# Patient Record
Sex: Female | Born: 1985
Health system: Southern US, Community
[De-identification: ages and names within clinical notes are randomized; demographics above are authoritative.]

## PROBLEM LIST (undated history)

## (undated) DIAGNOSIS — E669 Obesity, unspecified: Secondary | ICD-10-CM

## (undated) DIAGNOSIS — F419 Anxiety disorder, unspecified: Secondary | ICD-10-CM

## (undated) DIAGNOSIS — E162 Hypoglycemia, unspecified: Secondary | ICD-10-CM

## (undated) DIAGNOSIS — Z3490 Encounter for supervision of normal pregnancy, unspecified, unspecified trimester: Secondary | ICD-10-CM

## (undated) DIAGNOSIS — R0602 Shortness of breath: Secondary | ICD-10-CM

## (undated) DIAGNOSIS — Z8709 Personal history of other diseases of the respiratory system: Secondary | ICD-10-CM

## (undated) DIAGNOSIS — Z8759 Personal history of other complications of pregnancy, childbirth and the puerperium: Secondary | ICD-10-CM

## (undated) DIAGNOSIS — Z5189 Encounter for other specified aftercare: Secondary | ICD-10-CM

## (undated) DIAGNOSIS — O24419 Gestational diabetes mellitus in pregnancy, unspecified control: Secondary | ICD-10-CM

## (undated) HISTORY — DX: Gestational diabetes mellitus in pregnancy, unspecified control: O24.419

## (undated) HISTORY — DX: Hypoglycemia, unspecified: E16.2

## (undated) HISTORY — DX: Encounter for other specified aftercare: Z51.89

## (undated) HISTORY — DX: Personal history of other complications of pregnancy, childbirth and the puerperium: Z87.59

## (undated) HISTORY — DX: Obesity, unspecified: E66.9

## (undated) HISTORY — DX: Personal history of other diseases of the respiratory system: Z87.09

## (undated) HISTORY — DX: Anxiety disorder, unspecified: F41.9

---
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
## 2006-03-22 HISTORY — PX: DILATION AND CURETTAGE OF UTERUS: SHX78

## 2010-03-22 HISTORY — PX: OVARY SURGERY: SHX727

## 2010-03-22 HISTORY — PX: LEFT OOPHORECTOMY: SHX1961

## 2016-06-23 ENCOUNTER — Emergency Department (HOSPITAL_COMMUNITY)
Admission: EM | Admit: 2016-06-23 | Discharge: 2016-06-23 | Disposition: A | Discharge status: Home / Self Care | Payer: Self-pay

## 2016-08-31 ENCOUNTER — Ambulatory Visit: Payer: Self-pay | Admitting: Family Medicine

## 2016-09-30 ENCOUNTER — Encounter (HOSPITAL_COMMUNITY): Payer: Self-pay | Admitting: Emergency Medicine

## 2016-09-30 ENCOUNTER — Emergency Department (HOSPITAL_COMMUNITY)
Admission: EM | Admit: 2016-09-30 | Discharge: 2016-09-30 | Disposition: A | Discharge status: Home / Self Care | Payer: Self-pay | Attending: Emergency Medicine | Admitting: Emergency Medicine

## 2016-09-30 DIAGNOSIS — L02411 Cutaneous abscess of right axilla: Secondary | ICD-10-CM | POA: Insufficient documentation

## 2016-09-30 DIAGNOSIS — Z5321 Procedure and treatment not carried out due to patient leaving prior to being seen by health care provider: Secondary | ICD-10-CM | POA: Insufficient documentation

## 2016-09-30 NOTE — ED Triage Notes (Signed)
Pt presents with abscess to R axilla. Appeared 2 days ago. Hx of same.

## 2016-10-14 ENCOUNTER — Ambulatory Visit (INDEPENDENT_AMBULATORY_CARE_PROVIDER_SITE_OTHER): Payer: Self-pay | Admitting: Family Medicine

## 2016-10-14 ENCOUNTER — Encounter: Payer: Self-pay | Admitting: Family Medicine

## 2016-10-14 VITALS — BP 128/80 | HR 82 | Temp 98.1°F | Resp 16 | Ht 63.0 in | Wt 233.0 lb

## 2016-10-14 DIAGNOSIS — L723 Sebaceous cyst: Secondary | ICD-10-CM

## 2016-10-14 DIAGNOSIS — Z1389 Encounter for screening for other disorder: Secondary | ICD-10-CM

## 2016-10-14 LAB — POCT URINALYSIS DIP (DEVICE)
Bilirubin Urine: NEGATIVE
Glucose, UA: NEGATIVE mg/dL
KETONES UR: NEGATIVE mg/dL
Nitrite: NEGATIVE
PH: 5.5 (ref 5.0–8.0)
PROTEIN: NEGATIVE mg/dL
Urobilinogen, UA: 0.2 mg/dL (ref 0.0–1.0)

## 2016-10-14 MED ORDER — FLUCONAZOLE 150 MG PO TABS: 150.0000 mg | ORAL_TABLET | Freq: Once | ORAL | 0 refills | Status: AC

## 2016-10-14 MED ORDER — SULFAMETHOXAZOLE-TRIMETHOPRIM 800-160 MG PO TABS: 1.0000 | ORAL_TABLET | Freq: Two times a day (BID) | ORAL | 0 refills | Status: DC

## 2016-10-14 NOTE — Progress Notes (Signed)
Patient ID: Jennifer KinsmanLatasha Ellison, female    DOB: 1985-10-23, 31 y.o.   MRN: [id number]  PCP: Bing NeighborsHarris, Jaxsyn Catalfamo S, FNP  Chief Complaint  Patient presents with  . Establish Care  . Recurrent Skin Infections    under right armpit    Subjective:  HPI Jennifer KinsmanLatasha Ellison is a 31 y.o. female presents to establish care and for evaluation of skin irritation of right armpit. Medical history only significant hypoglycemia,ovarian cyst resulting in ovary removal (uncertain if right or left), and migraines Family history only significant for diabetes ( distant relatives only). Jennifer Ellison reports recurrent "boils" under bilateral axilla.  She has had prior boils I&D and has taking bactrim for prior infections. Social History   Social History  . Marital status: Single    Spouse name: N/A  . Number of children: N/A  . Years of education: N/A   Occupational History  . Not on file.   Social History Main Topics  . Smoking status: Never Smoker  . Smokeless tobacco: Never Used  . Alcohol use Yes  . Drug use: No  . Sexual activity: Not on file   Other Topics Concern  . Not on file   Social History Narrative  . No narrative on file   Review of Systems  HENT:       Seasonal allergies-takes Allegra  Respiratory: Negative.   Cardiovascular: Negative.   Gastrointestinal: Negative.   Endocrine: Negative.   Genitourinary: Negative.   Musculoskeletal: Negative.   Skin: Negative.   Neurological: Negative for dizziness.  Psychiatric/Behavioral: The patient is nervous/anxious.    Prior to Admission medications   Not on File    Past Medical, Surgical Family and Social History reviewed and updated.    Objective:   Today's Vitals   10/14/16 1322  BP: 128/80  Pulse: 82  Resp: 16  Temp: 98.1 F (36.7 C)  TempSrc: Oral  SpO2: 100%  Weight: 233 lb (105.7 kg)  Height: 5\' 3"  (1.6 m)    Wt Readings from Last 3 Encounters:  10/14/16 233 lb (105.7 kg)  09/30/16 210 lb (95.3 kg)   Physical  Exam  Constitutional: She is oriented to person, place, and time. She appears well-developed and well-nourished.  HENT:  Head: Normocephalic and atraumatic.  Eyes: Pupils are equal, round, and reactive to light. Conjunctivae are normal.  Neck: Normal range of motion. Neck supple.  Cardiovascular: Normal rate, regular rhythm, normal heart sounds and intact distal pulses.   Pulmonary/Chest: Effort normal and breath sounds normal.    Neurological: She is alert and oriented to person, place, and time.  Skin: Skin is warm and dry.  Psychiatric: She has a normal mood and affect. Her behavior is normal. Judgment and thought content normal.   Assessment & Plan:  1. Sebaceous cyst of right axilla, initiate antibiotic treatment initially and will attempt and  I&D if cyst doesn't resolve. - Meds ordered this encounter  Medications  . fluconazole (DIFLUCAN) 150 MG tablet    Sig: Take 1 tablet (150 mg total) by mouth once.    Dispense:  1 tablet    Refill:  0    Order Specific Question:   Supervising Provider    Answer:   Quentin AngstJEGEDE, OLUGBEMIGA E [id number]]  . sulfamethoxazole-trimethoprim (BACTRIM DS,SEPTRA DS) 800-160 MG tablet    Sig: Take 1 tablet by mouth 2 (two) times daily.    Dispense:  20 tablet    Refill:  0    Order Specific Question:   Supervising Provider  AnswerQuentin Angst:   JEGEDE, OLUGBEMIGA E [[id number]]    RTC: 2 weeks re-evaluation of right axilla cyst    Godfrey PickKimberly S. Tiburcio PeaHarris, MSN, FNP-C [facility]  [address] [place], [place] [address] [phone]

## 2016-10-14 NOTE — Patient Instructions (Signed)
Skin Abscess A skin abscess is an infected area on or under your skin that contains pus and other material. An abscess can happen almost anywhere on your body. Some abscesses break open (rupture) on their own. Most continue to get worse unless they are treated. The infection can spread deeper into the body and into your blood, which can make you feel sick. Treatment usually involves draining the abscess. Follow these instructions at home: Abscess Care  If you have an abscess that has not drained, place a warm, clean, wet washcloth over the abscess several times a day. Do this as told by your doctor.  Follow instructions from your doctor about how to take care of your abscess. Make sure you: ? Cover the abscess with a bandage (dressing). ? Change your bandage or gauze as told by your doctor. ? Wash your hands with soap and water before you change the bandage or gauze. If you cannot use soap and water, use hand sanitizer.  Check your abscess every day for signs that the infection is getting worse. Check for: ? More redness, swelling, or pain. ? More fluid or blood. ? Warmth. ? More pus or a bad smell. Medicines   Take over-the-counter and prescription medicines only as told by your doctor.  If you were prescribed an antibiotic medicine, take it as told by your doctor. Do not stop taking the antibiotic even if you start to feel better. General instructions  To avoid spreading the infection: ? Do not share personal care items, towels, or hot tubs with others. ? Avoid making skin-to-skin contact with other people.  Keep all follow-up visits as told by your doctor. This is important. Contact a doctor if:  You have more redness, swelling, or pain around your abscess.  You have more fluid or blood coming from your abscess.  Your abscess feels warm when you touch it.  You have more pus or a bad smell coming from your abscess.  You have a fever.  Your muscles ache.  You have  chills.  You feel sick. Get help right away if:  You have very bad (severe) pain.  You see red streaks on your skin spreading away from the abscess. This information is not intended to replace advice given to you by your health care provider. Make sure you discuss any questions you have with your health care provider. Document Released: 08/25/2007 Document Revised: 11/02/2015 Document Reviewed: 01/15/2015 Elsevier Interactive Patient Education  2018 Elsevier Inc[employer]  

## 2016-10-19 ENCOUNTER — Encounter: Payer: Self-pay | Admitting: Family Medicine

## 2016-10-20 ENCOUNTER — Ambulatory Visit: Payer: Self-pay | Admitting: Family Medicine

## 2016-10-27 ENCOUNTER — Encounter: Payer: Self-pay | Admitting: Family Medicine

## 2016-10-27 ENCOUNTER — Ambulatory Visit (INDEPENDENT_AMBULATORY_CARE_PROVIDER_SITE_OTHER): Payer: Self-pay | Admitting: Family Medicine

## 2016-10-27 DIAGNOSIS — L723 Sebaceous cyst: Secondary | ICD-10-CM

## 2016-10-27 MED ORDER — MELOXICAM 15 MG PO TABS: 15.0000 mg | ORAL_TABLET | Freq: Every day | ORAL | 0 refills | Status: DC

## 2016-10-27 MED ORDER — SULFAMETHOXAZOLE-TRIMETHOPRIM 800-160 MG PO TABS: 1.0000 | ORAL_TABLET | Freq: Two times a day (BID) | ORAL | 0 refills | Status: DC

## 2016-10-27 MED ORDER — SULFAMETHOXAZOLE-TRIMETHOPRIM 800-160 MG PO TABS: 1.0000 | ORAL_TABLET | Freq: Two times a day (BID) | ORAL | 0 refills | Status: AC

## 2016-10-31 NOTE — Progress Notes (Signed)
Patient ID: Jennifer Ellison, female    DOB: Jan 01, 1986, 31 y.o.   MRN: [id number]  PCP: Jennifer Neighbors, Jennifer Ellison  Chief Complaint  Patient presents with  . Follow-up    CYST UNDER ARM    Subjective:  HPI Jennifer Ellison is a 31 y.o. female presents for evaluation of cyst under right axilla with acute right arm pain radiating down upper arm pain. Jennifer Ellison was seen in office on 10/14/2016 and was evaluated for a suspicious cyst of the right axilla. The cyst was nonfluctuant and was thought to be unsuccessful if I&D were attempted. Jennifer Ellison was prescribed 10 days of Bactrim to treat infection as cyst were very painful to touch and has surrounding erythema. Since that visit she has completed the course of Bactrim. She reports that the cyst under her arms and no longer painful however remained hardened to touch. She complains of neck pain radiating from her axilla and to her upper anterior right arm. She denies any associated swelling, recent injury, or erythema. Pain is most pronounced with deep palpation or with pressure to the right upper arm just above the brachial region. She denies any associated fever, chills, or tenderness to touch cyst.  Social History   Social History  . Marital status: Single    Spouse name: N/A  . Number of children: N/A  . Years of education: N/A   Occupational History  . Not on file.   Social History Main Topics  . Smoking status: Never Smoker  . Smokeless tobacco: Never Used  . Alcohol use Yes  . Drug use: No  . Sexual activity: Not on file   Other Topics Concern  . Not on file   Social History Narrative  . No narrative on file    Family History  Problem Relation Age of Onset  . Asthma Brother   . Diabetes Maternal Grandmother    Review of Systems  See history of present illness No Known Allergies  Prior to Admission medications   Medication Sig Start Date End Date Taking? Authorizing Provider  sulfamethoxazole-trimethoprim (BACTRIM  DS,SEPTRA DS) 800-160 MG tablet Take 1 tablet by mouth 2 (two) times daily. 10/27/16 11/03/16 Yes Jennifer Neighbors, Jennifer Ellison  meloxicam (MOBIC) 15 MG tablet Take 1 tablet (15 mg total) by mouth daily. 10/27/16   Jennifer Neighbors, Jennifer Ellison    Past Medical, Surgical Family and Social History reviewed and updated.    Objective:   Today's Vitals   10/27/16 1341  BP: 117/66  Pulse: 73  Resp: 16  Temp: 98.2 F (36.8 C)  TempSrc: Oral  SpO2: 99%  Weight: 237 lb (107.5 kg)  Height: 5\' 3"  (1.6 m)    Wt Readings from Last 3 Encounters:  10/27/16 237 lb (107.5 kg)  10/14/16 233 lb (105.7 kg)  09/30/16 210 lb (95.3 kg)   Physical Exam  Constitutional: She appears well-developed and well-nourished.  HENT:  Head: Normocephalic and atraumatic.  Eyes: Pupils are equal, round, and reactive to light. Conjunctivae and EOM are normal.  Pulmonary/Chest: Effort normal.  Musculoskeletal: Normal range of motion.       Arms: Neurological: She is alert.  Skin: Skin is warm and dry.  Today's circular shaped nonfluctuant sebaceous cyst affixed under the right axilla. The erythema has resolved.  Psychiatric: She has a normal mood and affect. Her behavior is normal. Judgment and thought content normal.   Assessment & Plan:  1. Sebaceous cyst of right axilla, hardened erythemic adjacent masses. Unable to I&D due  to non-fluctuant masses. No known explanation for the radiating pain into the upper right arm. Will extend course of antibiotics for an additional 7 days and attempt another I&D in 1 week. - sulfamethoxazole-trimethoprim (BACTRIM DS,SEPTRA DS) 800-160 MG tablet; Take 1 tablet by mouth 2 (two) times daily.  Dispense: 14 tablet; Refill: 0  Return to care after completion of antibiotics.   Godfrey PickKimberly S. Tiburcio PeaHarris, MSN, Jennifer Ellison-C [facility]  [address] [place], [place] [address] [phone]

## 2016-11-24 ENCOUNTER — Ambulatory Visit: Payer: Medicaid Other | Admitting: Family Medicine

## 2016-12-15 ENCOUNTER — Encounter: Payer: Self-pay | Admitting: Family Medicine

## 2016-12-15 ENCOUNTER — Ambulatory Visit (INDEPENDENT_AMBULATORY_CARE_PROVIDER_SITE_OTHER): Payer: Self-pay | Admitting: Family Medicine

## 2016-12-15 VITALS — BP 134/90 | HR 72 | Temp 97.6°F | Resp 16 | Ht 63.0 in | Wt 239.0 lb

## 2016-12-15 DIAGNOSIS — J0100 Acute maxillary sinusitis, unspecified: Secondary | ICD-10-CM

## 2016-12-15 LAB — POCT URINALYSIS DIP (DEVICE)
BILIRUBIN URINE: NEGATIVE
GLUCOSE, UA: NEGATIVE mg/dL
KETONES UR: NEGATIVE mg/dL
Nitrite: NEGATIVE
PROTEIN: NEGATIVE mg/dL
Urobilinogen, UA: 0.2 mg/dL (ref 0.0–1.0)
pH: 7 (ref 5.0–8.0)

## 2016-12-15 MED ORDER — AMOXICILLIN-POT CLAVULANATE 875-125 MG PO TABS: 1.0000 | ORAL_TABLET | Freq: Two times a day (BID) | ORAL | 0 refills | Status: DC

## 2016-12-15 MED ORDER — FLUCONAZOLE 150 MG PO TABS: 150.0000 mg | ORAL_TABLET | Freq: Once | ORAL | 0 refills | Status: AC

## 2016-12-15 MED ORDER — IPRATROPIUM BROMIDE 0.03 % NA SOLN: 2.0000 | Freq: Two times a day (BID) | NASAL | 0 refills | Status: DC

## 2016-12-15 MED FILL — FLUCONAZOLE 150 MG TABLET: 150 | 2 days supply | Qty: 2 | Fill #0

## 2016-12-15 MED FILL — AMOX TR-K CLV 875-125 MG TA: 875-125 | 10 days supply | Qty: 20 | Fill #0

## 2016-12-15 NOTE — Patient Instructions (Addendum)
Goodrx.med to see where you can obtain your prescriptions cheaper.  Start Augmentin 1 tablet twice daily with food to avoid stomach upset. Complete all medication.  Take Diflucan 150 mg once if vaginal irritation develops  Administer atrovent nasal spray twice daily as needed for nasal congesiton.  Complete [employer] Patient assistance application    Sinusitis, Adult Sinusitis is soreness and inflammation of your sinuses. Sinuses are hollow spaces in the bones around your face. They are located:  Around your eyes.  In the middle of your forehead.  Behind your nose.  In your cheekbones.  Your sinuses and nasal passages are lined with a stringy fluid (mucus). Mucus normally drains out of your sinuses. When your nasal tissues get inflamed or swollen, the mucus can get trapped or blocked so air cannot flow through your sinuses. This lets bacteria, viruses, and funguses grow, and that leads to infection. Follow these instructions at home: Medicines  Take, use, or apply over-the-counter and prescription medicines only as told by your doctor. These may include nasal sprays.  If you were prescribed an antibiotic medicine, take it as told by your doctor. Do not stop taking the antibiotic even if you start to feel better. Hydrate and Humidify  Drink enough water to keep your pee (urine) clear or pale yellow.  Use a cool mist humidifier to keep the humidity level in your home above 50%.  Breathe in steam for 10-15 minutes, 3-4 times a day or as told by your doctor. You can do this in the bathroom while a hot shower is running.  Try not to spend time in cool or dry air. Rest  Rest as much as possible.  Sleep with your head raised (elevated).  Make sure to get enough sleep each night. General instructions  Put a warm, moist washcloth on your face 3-4 times a day or as told by your doctor. This will help with discomfort.  Wash your hands often with soap and water. If there is no  soap and water, use hand sanitizer.  Do not smoke. Avoid being around people who are smoking (secondhand smoke).  Keep all follow-up visits as told by your doctor. This is important. Contact a doctor if:  You have a fever.  Your symptoms get worse.  Your symptoms do not get better within 10 days. Get help right away if:  You have a very bad headache.  You cannot stop throwing up (vomiting).  You have pain or swelling around your face or eyes.  You have trouble seeing.  You feel confused.  Your neck is stiff.  You have trouble breathing. This information is not intended to replace advice given to you by your health care provider. Make sure you discuss any questions you have with your health care provider. Document Released: 08/25/2007 Document Revised: 11/02/2015 Document Reviewed: 01/01/2015 Elsevier Interactive Patient Education  [employer].

## 2016-12-15 NOTE — Progress Notes (Signed)
Patient ID: Jennifer Ellison, female    DOB: July 03, 1985, 31 y.o.   MRN: [id number]  PCP: Bing Neighbors, FNP  Chief Complaint  Patient presents with  . Follow-up  . Sinusitis    Subjective:  HPI Jennifer Ellison is a 31 y.o. female presents for evaluation of headache, facial pain, and nasal congestion. She reports a one week history of URI symptoms. Jennifer Ellison reports associated facial pain and body aches. Uncertain of fever, has felt warm. Denies associated cough, ear pain, sore throat, shortness of breath, wheezing, and chest tightness.  She has attempted relief with tylenol cold and sinus without relief of symptoms. Social History   Social History  . Marital status: Single    Spouse name: N/A  . Number of children: N/A  . Years of education: N/A   Occupational History  . Not on file.   Social History Main Topics  . Smoking status: Never Smoker  . Smokeless tobacco: Never Used  . Alcohol use Yes  . Drug use: No  . Sexual activity: Not on file   Other Topics Concern  . Not on file   Social History Narrative  . No narrative on file    Family History  Problem Relation Age of Onset  . Asthma Brother   . Diabetes Maternal Grandmother    Review of Systems See HPI  Prior to Admission medications   Medication Sig Start Date End Date Taking? Authorizing Provider  meloxicam (MOBIC) 15 MG tablet Take 1 tablet (15 mg total) by mouth daily. 10/27/16   Bing Neighbors, FNP    Past Medical, Surgical Family and Social History reviewed and updated.    Objective:   Today's Vitals   12/15/16 1014  BP: 134/90  Pulse: 72  Resp: 16  Temp: 97.6 F (36.4 C)  TempSrc: Oral  SpO2: 100%  Weight: 239 lb (108.4 kg)  Height:  (1.6 m)    Wt Readings from Last 3 Encounters:  12/15/16 239 lb (108.4 kg)  10/27/16 237 lb (107.5 kg)  10/14/16 233 lb (105.7 kg)    Physical Exam  Constitutional: She is oriented to person, place, and time.  HENT:  Right Ear: External  ear normal.  Left Ear: External ear normal.  Nose: Mucosal edema and sinus tenderness present. Right sinus exhibits maxillary sinus tenderness. Left sinus exhibits maxillary sinus tenderness.  Mouth/Throat: Oropharynx is clear and moist.  Eyes: Pupils are equal, round, and reactive to light. Conjunctivae are normal.  Neck: Neck supple.  Cardiovascular: Normal rate, regular rhythm and normal heart sounds.   Pulmonary/Chest: Effort normal and breath sounds normal.  Musculoskeletal: Normal range of motion.  Neurological: She is alert and oriented to person, place, and time.  Skin: Skin is warm and dry.  Psychiatric: She has a normal mood and affect. Her behavior is normal. Judgment and thought content normal.   Assessment & Plan:  1. Acute maxillary sinusitis, recurrence not specified  Meds ordered this encounter  Medications  . ipratropium (ATROVENT) 0.03 % nasal spray    Sig: Place 2 sprays into both nostrils 2 (two) times daily.    Dispense:  30 mL    Refill:  0    Order Specific Question:   Supervising Provider    Answer:   Quentin Angst [id number]  . amoxicillin-clavulanate (AUGMENTIN) 875-125 MG tablet    Sig: Take 1 tablet by mouth 2 (two) times daily.    Dispense:  20 tablet    Refill:  0  Order Specific Question:   Supervising Provider    Answer:   Quentin Angst [id number]  . fluconazole (DIFLUCAN) 150 MG tablet    Sig: Take 1 tablet (150 mg total) by mouth once. Repeat if needed    Dispense:  2 tablet    Refill:  0    Order Specific Question:   Supervising Provider    Answer:   Quentin Angst [id number]    If no improvement of symptoms, return for care as needed.    Godfrey Pick. Tiburcio Pea, MSN, FNP-C [facility]  [address] [place], [place] [address] [phone]

## 2017-05-11 ENCOUNTER — Ambulatory Visit: Payer: Medicaid Other | Admitting: Family Medicine

## 2017-05-16 ENCOUNTER — Ambulatory Visit: Payer: Medicaid Other | Admitting: Family Medicine

## 2017-06-14 ENCOUNTER — Ambulatory Visit: Payer: Medicaid Other | Admitting: Family Medicine

## 2017-08-17 ENCOUNTER — Ambulatory Visit (INDEPENDENT_AMBULATORY_CARE_PROVIDER_SITE_OTHER): Payer: Self-pay | Admitting: Family Medicine

## 2017-08-17 ENCOUNTER — Encounter: Payer: Self-pay | Admitting: Family Medicine

## 2017-08-17 VITALS — BP 112/78 | HR 82 | Temp 98.3°F | Ht 63.0 in | Wt 241.0 lb

## 2017-08-17 DIAGNOSIS — Z1329 Encounter for screening for other suspected endocrine disorder: Secondary | ICD-10-CM

## 2017-08-17 DIAGNOSIS — J302 Other seasonal allergic rhinitis: Secondary | ICD-10-CM

## 2017-08-17 DIAGNOSIS — Z13220 Encounter for screening for lipoid disorders: Secondary | ICD-10-CM

## 2017-08-17 DIAGNOSIS — Z131 Encounter for screening for diabetes mellitus: Secondary | ICD-10-CM

## 2017-08-17 DIAGNOSIS — R109 Unspecified abdominal pain: Secondary | ICD-10-CM

## 2017-08-17 DIAGNOSIS — Z09 Encounter for follow-up examination after completed treatment for conditions other than malignant neoplasm: Secondary | ICD-10-CM

## 2017-08-17 LAB — POCT URINALYSIS DIP (MANUAL ENTRY)
Bilirubin, UA: NEGATIVE
Glucose, UA: NEGATIVE mg/dL
Ketones, POC UA: NEGATIVE mg/dL
Nitrite, UA: NEGATIVE
Protein Ur, POC: NEGATIVE mg/dL
Spec Grav, UA: 1.025 (ref 1.010–1.025)
Urobilinogen, UA: 1 E.U./dL
pH, UA: 5.5 (ref 5.0–8.0)

## 2017-08-17 LAB — POCT GLYCOSYLATED HEMOGLOBIN (HGB A1C): Hemoglobin A1C: 5.4 % (ref 4.0–5.6)

## 2017-08-17 MED ORDER — CETIRIZINE HCL 10 MG PO TABS: 10.0000 mg | ORAL_TABLET | Freq: Every day | ORAL | 11 refills | Status: DC

## 2017-08-17 NOTE — Progress Notes (Signed)
Subjective:     Patient ID: Jennifer Ellison, female   DOB: [birth year], 32 y.o.   MRN: [id number]   PCP: Raliegh Ip, NP   Chief Complaint  Patient presents with  . Abdominal Pain    lower right side    Current Status: Since her last office visit, she has been doing well, and has good energy. She is here today for her 3 months follow up.  She has been having mild right sided abdominal pain, which she thinks is r/t her IUD. Denies nausea, vomiting, diarrhea, and constipation. She denies any bleeding episoideI has been almost 6 years since last her last IUD was placed. It is time to replace it.   Denies cough, shortness of breath, chest pain, and heart palpitations. Denies visual changes, headaches, dizziness, and falls.   History reviewed. No pertinent past medical history.  Family History  Problem Relation Age of Onset  . Asthma Brother   . Diabetes Maternal Grandmother     Social History   Socioeconomic History  . Marital status: Single    Spouse name: Not on file  . Number of children: Not on file  . Years of education: Not on file  . Highest education level: Not on file  Occupational History  . Not on file  Social Needs  . Financial resource strain: Not on file  . Food insecurity:    Worry: Not on file    Inability: Not on file  . Transportation needs:    Medical: Not on file    Non-medical: Not on file  Tobacco Use  . Smoking status: Never Smoker  . Smokeless tobacco: Never Used  Substance and Sexual Activity  . Alcohol use: Yes  . Drug use: No  . Sexual activity: Not on file  Lifestyle  . Physical activity:    Days per week: Not on file    Minutes per session: Not on file  . Stress: Not on file  Relationships  . Social connections:    Talks on phone: Not on file    Gets together: Not on file    Attends religious service: Not on file    Active member of club or organization: Not on file    Attends meetings of clubs or organizations: Not on file     Relationship status: Not on file  Other Topics Concern  . Not on file  Social History Narrative  . Not on file    History reviewed. No pertinent surgical history.    There is no immunization history on file for this patient.  No outpatient medications have been marked as taking for the 08/17/17 encounter (Office Visit) with Kallie Locks, FNP.   No Known Allergies   BP 112/78 (BP Location: Left Arm, Patient Position: Sitting, Cuff Size: Large)   Pulse 82   Temp 98.3 F (36.8 C) (Oral)   Ht  (1.6 m)   Wt 241 lb (109.3 kg)   SpO2 100%   BMI 42.69 kg/m   Review of Systems   Objective:   Physical Exam  Constitutional: She is oriented to person, place, and time. She appears well-developed and well-nourished.  HENT:  Head: Normocephalic and atraumatic.  Mouth/Throat: Oropharynx is clear and moist.  Eyes: Pupils are equal, round, and reactive to light. EOM are normal.  Cardiovascular: Normal rate, regular rhythm and normal heart sounds.  Abdominal: Soft. Bowel sounds are increased.  Left sided abdominal pain.   Neurological: She is alert and oriented to person,  place, and time.  Skin: Skin is warm and dry. Capillary refill takes less than 2 seconds.  Psychiatric: She has a normal mood and affect. Her behavior is normal.  Nursing note and vitals reviewed.    Assessment:   1. Abdominal pain, unspecified abdominal location 2. Seasonal allergies 3. Screening for diabetes mellitus 4. Screening for hypothyroidism 5. Screening for hyperlipidemia 6. Follow up  Plan:   1. Abdominal pain, unspecified abdominal location We will refer her to OB-GYN to assess removal of IUD. - POCT urinalysis dipstick  2. Seasonal allergies Stable. We will  - cetirizine (ZYRTEC) 10 MG tablet; Take 1 tablet (10 mg total) by mouth daily.  Dispense: 30 tablet; Refill: 11  3. Screening for diabetes mellitus - Comprehensive metabolic panel; Future - HgB A1c - Comprehensive  metabolic panel  4. Screening for hypothyroidism - TSH  5. Screening for hyperlipidemia - CBC with Differential - Lipid Panel  6. Follow up She will follow up in 3 months.   Meds ordered this encounter  Medications  . cetirizine (ZYRTEC) 10 MG tablet    Sig: Take 1 tablet (10 mg total) by mouth daily.    Dispense:  30 tablet    Refill:  11   Raliegh Ip,  MSN, FNP-BC [facility] [address] [place], [place] [address] [phone]

## 2017-08-18 ENCOUNTER — Ambulatory Visit: Payer: Medicaid Other | Admitting: Family Medicine

## 2017-08-18 LAB — COMPREHENSIVE METABOLIC PANEL
ALT: 13 IU/L (ref 0–32)
AST: 17 IU/L (ref 0–40)
Albumin/Globulin Ratio: 1.6 (ref 1.2–2.2)
Albumin: 4.3 g/dL (ref 3.5–5.5)
Alkaline Phosphatase: 72 IU/L (ref 39–117)
BUN/Creatinine Ratio: 17 (ref 9–23)
BUN: 12 mg/dL (ref 6–20)
Bilirubin Total: 0.3 mg/dL (ref 0.0–1.2)
CO2: 24 mmol/L (ref 20–29)
Calcium: 8.9 mg/dL (ref 8.7–10.2)
Chloride: 103 mmol/L (ref 96–106)
Creatinine, Ser: 0.69 mg/dL (ref 0.57–1.00)
GFR calc Af Amer: 134 mL/min/{1.73_m2} (ref >59)
GFR calc non Af Amer: 116 mL/min/{1.73_m2} (ref >59)
Globulin, Total: 2.7 g/dL (ref 1.5–4.5)
Glucose: 78 mg/dL (ref 65–99)
Potassium: 3.7 mmol/L (ref 3.5–5.2)
Sodium: 141 mmol/L (ref 134–144)
Total Protein: 7 g/dL (ref 6.0–8.5)

## 2017-08-18 LAB — CBC WITH DIFFERENTIAL/PLATELET
Basophils Absolute: 0 10*3/uL (ref 0.0–0.2)
Basos: 0 %
EOS (ABSOLUTE): 0.1 10*3/uL (ref 0.0–0.4)
Eos: 2 %
Hematocrit: 39.5 % (ref 34.0–46.6)
Hemoglobin: 12.8 g/dL (ref 11.1–15.9)
Immature Grans (Abs): 0 10*3/uL (ref 0.0–0.1)
Immature Granulocytes: 0 %
Lymphocytes Absolute: 2.5 10*3/uL (ref 0.7–3.1)
Lymphs: 40 %
MCH: 28.4 pg (ref 26.6–33.0)
MCHC: 32.4 g/dL (ref 31.5–35.7)
MCV: 88 fL (ref 79–97)
Monocytes Absolute: 0.3 10*3/uL (ref 0.1–0.9)
Monocytes: 5 %
Neutrophils Absolute: 3.3 10*3/uL (ref 1.4–7.0)
Neutrophils: 53 %
Platelets: 314 10*3/uL (ref 150–450)
RBC: 4.51 x10E6/uL (ref 3.77–5.28)
RDW: 13.7 % (ref 12.3–15.4)
WBC: 6.4 10*3/uL (ref 3.4–10.8)

## 2017-08-18 LAB — LIPID PANEL
Chol/HDL Ratio: 3.3 ratio (ref 0.0–4.4)
Cholesterol, Total: 143 mg/dL (ref 100–199)
HDL: 44 mg/dL (ref >39)
LDL Calculated: 88 mg/dL (ref 0–99)
Triglycerides: 53 mg/dL (ref 0–149)
VLDL Cholesterol Cal: 11 mg/dL (ref 5–40)

## 2017-08-18 LAB — TSH: TSH: 1.48 u[IU]/mL (ref 0.450–4.500)

## 2017-08-20 ENCOUNTER — Encounter: Payer: Self-pay | Admitting: Family Medicine

## 2017-09-07 ENCOUNTER — Telehealth: Payer: Self-pay

## 2017-09-07 NOTE — Telephone Encounter (Signed)
Patient called regarding lab results 

## 2017-10-24 ENCOUNTER — Encounter: Payer: Self-pay | Admitting: Family Medicine

## 2017-10-24 ENCOUNTER — Other Ambulatory Visit: Payer: Self-pay | Admitting: Family Medicine

## 2017-10-24 ENCOUNTER — Ambulatory Visit (INDEPENDENT_AMBULATORY_CARE_PROVIDER_SITE_OTHER): Payer: Self-pay | Admitting: Family Medicine

## 2017-10-24 VITALS — BP 124/88 | HR 88 | Ht 63.0 in | Wt 245.0 lb

## 2017-10-24 DIAGNOSIS — Z09 Encounter for follow-up examination after completed treatment for conditions other than malignant neoplasm: Secondary | ICD-10-CM

## 2017-10-24 DIAGNOSIS — M25552 Pain in left hip: Secondary | ICD-10-CM

## 2017-10-24 MED ORDER — CYCLOBENZAPRINE HCL 10 MG PO TABS: 10.0000 mg | ORAL_TABLET | Freq: Three times a day (TID) | ORAL | 1 refills | Status: DC | PRN

## 2017-10-24 NOTE — Patient Instructions (Signed)
Cyclobenzaprine tablets What is this medicine? CYCLOBENZAPRINE (sye kloe BEN za preen) is a muscle relaxer. It is used to treat muscle pain, spasms, and stiffness. This medicine may be used for other purposes; ask your health care provider or pharmacist if you have questions. COMMON BRAND NAME(S): Fexmid, Flexeril What should I tell my health care provider before I take this medicine? They need to know if you have any of these conditions: -heart disease, irregular heartbeat, or previous heart attack -liver disease -thyroid problem -an unusual or allergic reaction to cyclobenzaprine, tricyclic antidepressants, lactose, other medicines, foods, dyes, or preservatives -pregnant or trying to get pregnant -breast-feeding How should I use this medicine? Take this medicine by mouth with a glass of water. Follow the directions on the prescription label. If this medicine upsets your stomach, take it with food or milk. Take your medicine at regular intervals. Do not take it more often than directed. Talk to your pediatrician regarding the use of this medicine in children. Special care may be needed. Overdosage: If you think you have taken too much of this medicine contact a poison control center or emergency room at once. NOTE: This medicine is only for you. Do not share this medicine with others. What if I miss a dose? If you miss a dose, take it as soon as you can. If it is almost time for your next dose, take only that dose. Do not take double or extra doses. What may interact with this medicine? Do not take this medicine with any of the following medications: -certain medicines for fungal infections like fluconazole, itraconazole, ketoconazole, posaconazole, voriconazole -cisapride -dofetilide -dronedarone -halofantrine -levomethadyl -MAOIs like Carbex, Eldepryl, Marplan, Nardil, and Parnate -narcotic medicines for cough -pimozide -thioridazine -ziprasidone This medicine may also interact  with the following medications: -alcohol -antihistamines for allergy, cough and cold -certain medicines for anxiety or sleep -certain medicines for cancer -certain medicines for depression like amitriptyline, fluoxetine, sertraline -certain medicines for infection like alfuzosin, chloroquine, clarithromycin, levofloxacin, mefloquine, pentamidine, troleandomycin -certain medicines for irregular heart beat -certain medicines for seizures like phenobarbital, primidone -contrast dyes -general anesthetics like halothane, isoflurane, methoxyflurane, propofol -local anesthetics like lidocaine, pramoxine, tetracaine -medicines that relax muscles for surgery -narcotic medicines for pain -other medicines that prolong the QT interval (cause an abnormal heart rhythm) -phenothiazines like chlorpromazine, mesoridazine, prochlorperazine This list may not describe all possible interactions. Give your health care provider a list of all the medicines, herbs, non-prescription drugs, or dietary supplements you use. Also tell them if you smoke, drink alcohol, or use illegal drugs. Some items may interact with your medicine. What should I watch for while using this medicine? Tell your doctor or health care professional if your symptoms do not start to get better or if they get worse. You may get drowsy or dizzy. Do not drive, use machinery, or do anything that needs mental alertness until you know how this medicine affects you. Do not stand or sit up quickly, especially if you are an older patient. This reduces the risk of dizzy or fainting spells. Alcohol may interfere with the effect of this medicine. Avoid alcoholic drinks. If you are taking another medicine that also causes drowsiness, you may have more side effects. Give your health care provider a list of all medicines you use. Your doctor will tell you how much medicine to take. Do not take more medicine than directed. Call emergency for help if you have  problems breathing or unusual sleepiness. Your mouth may get dry. Chewing   sugarless gum or sucking hard candy, and drinking plenty of water may help. Contact your doctor if the problem does not go away or is severe. What side effects may I notice from receiving this medicine? Side effects that you should report to your doctor or health care professional as soon as possible: -allergic reactions like skin rash, itching or hives, swelling of the face, lips, or tongue -breathing problems -chest pain -fast, irregular heartbeat -hallucinations -seizures -unusually weak or tired Side effects that usually do not require medical attention (report to your doctor or health care professional if they continue or are bothersome): -headache -nausea, vomiting This list may not describe all possible side effects. Call your doctor for medical advice about side effects. You may report side effects to FDA at 1-800-FDA-1088. Where should I keep my medicine? Keep out of the reach of children. Store at room temperature between 15 and 30 degrees C (59 and 86 degrees F). Keep container tightly closed. Throw away any unused medicine after the expiration date. NOTE: This sheet is a summary. It may not cover all possible information. If you have questions about this medicine, talk to your doctor, pharmacist, or health care provider.  2018 Elsevier/Gold Standard (2014-12-17 12:05:46)  

## 2017-10-24 NOTE — Progress Notes (Signed)
Sick Visit  Subjective:    Patient ID: Jennifer Ellison, female    DOB: Dec 01, 1985, 32 y.o.   MRN: [id number]   Chief Complaint  Patient presents with  . Hip Pain    Left   HPI  Jennifer Ellison does not have a pertinent past medical history. She is here today for left hip pain   Current Status: Since her last office visit this past weekend, she developed hip discomfort, when her son slept on her left hip and she awoke with pain.   She denies fevers, chills, fatigue, recent infections, weight loss, and night sweats. She has not had any headaches, visual changes, dizziness, and falls. No chest pain, heart palpitations, cough and shortness of breath reported. No reports of GI problems such as nausea, vomiting, diarrhea, and constipation. She has no reports of blood in stools, dysuria and hematuria. No depression or anxiety reported.    History reviewed. No pertinent past medical history.  Family History  Problem Relation Age of Onset  . Asthma Brother   . Diabetes Maternal Grandmother     Social History   Socioeconomic History  . Marital status: Single    Spouse name: Not on file  . Number of children: Not on file  . Years of education: Not on file  . Highest education level: Not on file  Occupational History  . Not on file  Social Needs  . Financial resource strain: Not on file  . Food insecurity:    Worry: Not on file    Inability: Not on file  . Transportation needs:    Medical: Not on file    Non-medical: Not on file  Tobacco Use  . Smoking status: Never Smoker  . Smokeless tobacco: Never Used  Substance and Sexual Activity  . Alcohol use: Yes  . Drug use: No  . Sexual activity: Not on file  Lifestyle  . Physical activity:    Days per week: Not on file    Minutes per session: Not on file  . Stress: Not on file  Relationships  . Social connections:    Talks on phone: Not on file    Gets together: Not on file    Attends religious service: Not on file    Active  member of club or organization: Not on file    Attends meetings of clubs or organizations: Not on file    Relationship status: Not on file  . Intimate partner violence:    Fear of current or ex partner: Not on file    Emotionally abused: Not on file    Physically abused: Not on file    Forced sexual activity: Not on file  Other Topics Concern  . Not on file  Social History Narrative  . Not on file    History reviewed. No pertinent surgical history.    There is no immunization history on file for this patient.   No outpatient medications have been marked as taking for the 10/24/17 encounter (Office Visit) with Kallie Locks, FNP.    BP 124/88 (BP Location: Left Arm, Patient Position: Sitting, Cuff Size: Large)   Pulse 88   Ht 5\' 3"  (1.6 m)   Wt 245 lb (111.1 kg)   SpO2 98%   BMI 43.40 kg/m   Review of Systems  Constitutional: Negative.   Respiratory: Negative.   Cardiovascular: Negative.   Musculoskeletal:       Left hip pain   Skin: Negative.   Neurological: Negative.  Psychiatric/Behavioral: Negative.    Objective:   Physical Exam  Constitutional: She appears well-developed and well-nourished.  Musculoskeletal:  Limited ROM in left hip   Assessment & Plan:   1. Pain of left hip joint - cyclobenzaprine (FLEXERIL) 10 MG tablet; Take 1 tablet (10 mg total) by mouth 3 (three) times daily as needed for muscle spasms.  Dispense: 30 tablet; Refill: 1  2. Follow up She will keep her previous appointment on 07/2018.  Meds ordered this encounter  Medications  . cyclobenzaprine (FLEXERIL) 10 MG tablet    Sig: Take 1 tablet (10 mg total) by mouth 3 (three) times daily as needed for muscle spasms.    Dispense:  30 tablet    Refill:  1   Raliegh IpNatalie Kristianna Saperstein,  MSN, FNP-C Patient [facility] [address]  [place], [place] [id number] [phone]

## 2017-10-26 ENCOUNTER — Ambulatory Visit: Payer: Medicaid Other | Admitting: Obstetrics

## 2017-11-10 ENCOUNTER — Other Ambulatory Visit: Payer: Self-pay | Admitting: Obstetrics

## 2017-11-10 ENCOUNTER — Other Ambulatory Visit (HOSPITAL_COMMUNITY)
Admission: RE | Admit: 2017-11-10 | Discharge: 2017-11-10 | Disposition: A | Discharge status: Home / Self Care | Payer: Medicaid Other | Source: Ambulatory Visit | Attending: Obstetrics | Admitting: Obstetrics

## 2017-11-10 ENCOUNTER — Ambulatory Visit (INDEPENDENT_AMBULATORY_CARE_PROVIDER_SITE_OTHER): Payer: Medicaid Other | Admitting: Obstetrics

## 2017-11-10 ENCOUNTER — Encounter: Payer: Self-pay | Admitting: Obstetrics

## 2017-11-10 VITALS — BP 124/85 | HR 92 | Ht 62.25 in | Wt 247.4 lb

## 2017-11-10 DIAGNOSIS — Z113 Encounter for screening for infections with a predominantly sexual mode of transmission: Secondary | ICD-10-CM

## 2017-11-10 DIAGNOSIS — Z3009 Encounter for other general counseling and advice on contraception: Secondary | ICD-10-CM

## 2017-11-10 DIAGNOSIS — Z975 Presence of (intrauterine) contraceptive device: Secondary | ICD-10-CM | POA: Diagnosis not present

## 2017-11-10 DIAGNOSIS — Z30431 Encounter for routine checking of intrauterine contraceptive device: Secondary | ICD-10-CM

## 2017-11-10 DIAGNOSIS — N898 Other specified noninflammatory disorders of vagina: Secondary | ICD-10-CM

## 2017-11-10 DIAGNOSIS — Z01419 Encounter for gynecological examination (general) (routine) without abnormal findings: Secondary | ICD-10-CM | POA: Insufficient documentation

## 2017-11-10 DIAGNOSIS — Z0000 Encounter for general adult medical examination without abnormal findings: Secondary | ICD-10-CM | POA: Diagnosis not present

## 2017-11-10 NOTE — Progress Notes (Signed)
Subjective:        Jennifer Ellison is a 32 y.o. female here for a routine exam.  Current complaints: None.  Mirena IUD has been in for ~5 years, and she would like to schedule remonal and would like to start OCP's.  Personal health questionnaire:  Is patient Jennifer Ellison, have a family history of breast and/or ovarian cancer: no Is there a family history of uterine cancer diagnosed at age < 29, gastrointestinal cancer, urinary tract cancer, family member who is a Personnel officer syndrome-associated carrier: no Is the patient overweight and hypertensive, family history of diabetes, personal history of gestational diabetes, preeclampsia or PCOS: no Is patient over 70, have PCOS,  family history of premature CHD under age 55, diabetes, smoke, have hypertension or peripheral artery disease:  no At any time, has a partner hit, kicked or otherwise hurt or frightened you?: no Over the past 2 weeks, have you felt down, depressed or hopeless?: no Over the past 2 weeks, have you felt little interest or pleasure in doing things?:no   Gynecologic History No LMP recorded. (Menstrual status: IUD). Contraception: IUD Last Pap: 2018. Results were: normal Last mammogram: n/a. Results were: n/a  Obstetric History OB History  Gravida Para Term Preterm AB Living  4 2 2   2 2   SAB TAB Ectopic Multiple Live Births  2       2    # Outcome Date GA Lbr Len/2nd Weight Sex Delivery Anes PTL Lv  4 Term 06/20/06    M Vag-Spont   LIV  3 SAB 2008          2 Term 04/20/02    M Vag-Spont   LIV  1 SAB 2003            Past Medical History:  Diagnosis Date  . Hypoglycemia     Past Surgical History:  Procedure Laterality Date  . DILATION AND CURETTAGE OF UTERUS  2008  . OVARY SURGERY  2012   left ovary removed; cyst related     Current Outpatient Medications:  .  cetirizine (ZYRTEC) 10 MG tablet, Take 1 tablet (10 mg total) by mouth daily., Disp: 30 tablet, Rfl: 11 .  cyclobenzaprine (FLEXERIL) 10 MG  tablet, Take 1 tablet (10 mg total) by mouth 3 (three) times daily as needed for muscle spasms. (Patient not taking: Reported on 11/10/2017), Disp: 30 tablet, Rfl: 1 .  ipratropium (ATROVENT) 0.03 % nasal spray, Place 2 sprays into both nostrils 2 (two) times daily. (Patient not taking: Reported on 08/17/2017), Disp: 30 mL, Rfl: 0 .  meloxicam (MOBIC) 15 MG tablet, Take 1 tablet (15 mg total) by mouth daily. (Patient not taking: Reported on 08/17/2017), Disp: 30 tablet, Rfl: 0 Allergies  Allergen Reactions  . Blueberry Flavor Swelling    Social History   Tobacco Use  . Smoking status: Never Smoker  . Smokeless tobacco: Never Used  Substance Use Topics  . Alcohol use: Yes    Family History  Problem Relation Age of Onset  . Asthma Brother   . Diabetes Maternal Grandmother       Review of Systems  Constitutional: negative for fatigue and weight loss Respiratory: negative for cough and wheezing Cardiovascular: negative for chest pain, fatigue and palpitations Gastrointestinal: negative for abdominal pain and change in bowel habits Musculoskeletal:negative for myalgias Neurological: negative for gait problems and tremors Behavioral/Psych: negative for abusive relationship, depression Endocrine: negative for temperature intolerance    Genitourinary:negative for abnormal menstrual periods, genital  lesions, hot flashes, sexual problems and vaginal discharge Integument/breast: negative for breast lump, breast tenderness, nipple discharge and skin lesion(s)    Objective:       BP 124/85   Pulse 92   Ht 5' 2.25" (1.581 m)   Wt 247 lb 6.4 oz (112.2 kg)   BMI 44.89 kg/m  General:   alert  Skin:   no rash or abnormalities  Lungs:   clear to auscultation bilaterally  Heart:   regular rate and rhythm, S1, S2 normal, no murmur, click, rub or gallop  Breasts:   normal without suspicious masses, skin or nipple changes or axillary nodes  Abdomen:  normal findings: no organomegaly, soft,  non-tender and no hernia  Pelvis:  External genitalia: normal general appearance Urinary system: urethral meatus normal and bladder without fullness, nontender Vaginal: normal without tenderness, induration or masses Cervix: normal appearance Adnexa: normal bimanual exam Uterus: anteverted and non-tender, normal size   Lab Review Urine pregnancy test Labs reviewed yes Radiologic studies reviewed no  50% of 20 min visit spent on counseling and coordination of care.   Assessment:     1. Encounter for routine gynecological examination with Papanicolaou smear of cervix Rx: - Cytology - PAP  2. Vaginal discharge Rx: - Cervicovaginal ancillary only  3. Screening for STD (sexually transmitted disease) Rx: - HIV antibody - Hepatitis B surface antigen - RPR - Hepatitis C antibody  4. Encounter for routine checking of intrauterine contraceptive device (IUD) - IUD in place  5. Encounter for other general counseling and advice on contraception - wants to start OCP's when IUD is removed    Plan:    Education reviewed: calcium supplements, depression evaluation, low fat, low cholesterol diet, safe sex/STD prevention, self breast exams and weight bearing exercise. Contraception: OCP (estrogen/progesterone). Follow up in: 2 weeks.  IUD Removal.  No orders of the defined types were placed in this encounter.  Orders Placed This Encounter  Procedures  . HIV antibody  . Hepatitis B surface antigen  . RPR  . Hepatitis C antibody    Jennifer BadHARLES A. Destina Mantei MD 11-10-2017

## 2017-11-10 NOTE — Progress Notes (Signed)
Patient is New GYN, in the office for annual. Last pap Feb 2018, transfer [place]. Pt wants std testing today. Pt has IUD, considering having it removed.

## 2017-11-11 LAB — CERVICOVAGINAL ANCILLARY ONLY
CHLAMYDIA, DNA PROBE: NEGATIVE
NEISSERIA GONORRHEA: NEGATIVE

## 2017-11-11 LAB — CYTOLOGY - PAP
Diagnosis: NEGATIVE
HPV: NOT DETECTED

## 2017-11-11 LAB — HIV ANTIBODY (ROUTINE TESTING W REFLEX): HIV SCREEN 4TH GENERATION: NONREACTIVE

## 2017-11-11 LAB — RPR: RPR Ser Ql: NONREACTIVE

## 2017-11-11 LAB — HEPATITIS C ANTIBODY: Hep C Virus Ab: <0.1 s/co ratio (ref 0.0–0.9)

## 2017-11-11 LAB — HEPATITIS B SURFACE ANTIGEN: Hepatitis B Surface Ag: NEGATIVE

## 2017-11-13 ENCOUNTER — Other Ambulatory Visit: Payer: Self-pay | Admitting: Obstetrics

## 2017-11-13 DIAGNOSIS — B3731 Acute candidiasis of vulva and vagina: Secondary | ICD-10-CM

## 2017-11-13 DIAGNOSIS — B373 Candidiasis of vulva and vagina: Secondary | ICD-10-CM

## 2017-11-13 MED ORDER — FLUCONAZOLE 150 MG PO TABS: 150.0000 mg | ORAL_TABLET | Freq: Once | ORAL | 0 refills | Status: DC

## 2017-11-14 ENCOUNTER — Other Ambulatory Visit: Payer: Self-pay

## 2017-11-14 DIAGNOSIS — B373 Candidiasis of vulva and vagina: Secondary | ICD-10-CM

## 2017-11-14 DIAGNOSIS — B3731 Acute candidiasis of vulva and vagina: Secondary | ICD-10-CM

## 2017-11-14 MED ORDER — FLUCONAZOLE 150 MG PO TABS: 150.0000 mg | ORAL_TABLET | Freq: Once | ORAL | 0 refills | Status: AC

## 2017-11-14 NOTE — Progress Notes (Signed)
Pt needed Rx to [facility].

## 2017-11-25 ENCOUNTER — Ambulatory Visit (INDEPENDENT_AMBULATORY_CARE_PROVIDER_SITE_OTHER): Payer: Self-pay | Admitting: Family Medicine

## 2017-11-25 ENCOUNTER — Encounter: Payer: Self-pay | Admitting: Family Medicine

## 2017-11-25 VITALS — BP 140/80 | HR 84 | Temp 98.0°F | Ht 62.25 in | Wt 247.0 lb

## 2017-11-25 DIAGNOSIS — J019 Acute sinusitis, unspecified: Secondary | ICD-10-CM

## 2017-11-25 DIAGNOSIS — Z09 Encounter for follow-up examination after completed treatment for conditions other than malignant neoplasm: Secondary | ICD-10-CM

## 2017-11-25 DIAGNOSIS — J302 Other seasonal allergic rhinitis: Secondary | ICD-10-CM

## 2017-11-25 MED ORDER — CETIRIZINE HCL 10 MG PO TABS: 10.0000 mg | ORAL_TABLET | Freq: Every day | ORAL | 11 refills | Status: DC

## 2017-11-25 MED ORDER — ALBUTEROL SULFATE HFA 108 (90 BASE) MCG/ACT IN AERS: 2.0000 | INHALATION_SPRAY | Freq: Four times a day (QID) | RESPIRATORY_TRACT | 0 refills | Status: DC | PRN

## 2017-11-25 MED ORDER — SALINE SPRAY 0.65 % NA SOLN: 1.0000 | NASAL | 3 refills | Status: DC | PRN

## 2017-11-25 MED ORDER — AMOXICILLIN-POT CLAVULANATE 875-125 MG PO TABS: 1.0000 | ORAL_TABLET | Freq: Two times a day (BID) | ORAL | 0 refills | Status: AC

## 2017-11-25 NOTE — Patient Instructions (Signed)
Amoxicillin; Clavulanic Acid tablets What is this medicine? AMOXICILLIN; CLAVULANIC ACID (a mox i SIL in; KLAV yoo lan ic AS id) is a penicillin antibiotic. It is used to treat certain kinds of bacterial infections. It will not work for colds, flu, or other viral infections. This medicine may be used for other purposes; ask your health care provider or pharmacist if you have questions. COMMON BRAND NAME(S): Augmentin What should I tell my health care provider before I take this medicine? They need to know if you have any of these conditions: -bowel disease, like colitis -kidney disease -liver disease -mononucleosis -an unusual or allergic reaction to amoxicillin, penicillin, cephalosporin, other antibiotics, clavulanic acid, other medicines, foods, dyes, or preservatives -pregnant or trying to get pregnant -breast-feeding How should I use this medicine? Take this medicine by mouth with a full glass of water. Follow the directions on the prescription label. Take at the start of a meal. Do not crush or chew. If the tablet has a score line, you may cut it in half at the score line for easier swallowing. Take your medicine at regular intervals. Do not take your medicine more often than directed. Take all of your medicine as directed even if you think you are better. Do not skip doses or stop your medicine early. Talk to your pediatrician regarding the use of this medicine in children. Special care may be needed. Overdosage: If you think you have taken too much of this medicine contact a poison control center or emergency room at once. NOTE: This medicine is only for you. Do not share this medicine with others. What if I miss a dose? If you miss a dose, take it as soon as you can. If it is almost time for your next dose, take only that dose. Do not take double or extra doses. What may interact with this medicine? -allopurinol -anticoagulants -birth control pills -methotrexate -probenecid This  list may not describe all possible interactions. Give your health care provider a list of all the medicines, herbs, non-prescription drugs, or dietary supplements you use. Also tell them if you smoke, drink alcohol, or use illegal drugs. Some items may interact with your medicine. What should I watch for while using this medicine? Tell your doctor or health care professional if your symptoms do not improve. Do not treat diarrhea with over the counter products. Contact your doctor if you have diarrhea that lasts more than 2 days or if it is severe and watery. If you have diabetes, you may get a false-positive result for sugar in your urine. Check with your doctor or health care professional. Birth control pills may not work properly while you are taking this medicine. Talk to your doctor about using an extra method of birth control. What side effects may I notice from receiving this medicine? Side effects that you should report to your doctor or health care professional as soon as possible: -allergic reactions like skin rash, itching or hives, swelling of the face, lips, or tongue -breathing problems -dark urine -fever or chills, sore throat -redness, blistering, peeling or loosening of the skin, including inside the mouth -seizures -trouble passing urine or change in the amount of urine -unusual bleeding, bruising -unusually weak or tired -white patches or sores in the mouth or throat Side effects that usually do not require medical attention (report to your doctor or health care professional if they continue or are bothersome): -diarrhea -dizziness -headache -nausea, vomiting -stomach upset -vaginal or anal irritation This list may   not describe all possible side effects. Call your doctor for medical advice about side effects. You may report side effects to FDA at 1-800-FDA-1088. Where should I keep my medicine? Keep out of the reach of children. Store at room temperature below 25 degrees  C (77 degrees F). Keep container tightly closed. Throw away any unused medicine after the expiration date. NOTE: This sheet is a summary. It may not cover all possible information. If you have questions about this medicine, talk to your doctor, pharmacist, or health care provider.  2018 Elsevier/Gold Standard (2007-06-01 12:04:30)  

## 2017-11-25 NOTE — Progress Notes (Signed)
Sick Visit  Subjective:    Patient ID: Jennifer Ellison, female    DOB: [birth year], 32 y.o.   MRN: [id number]   Chief Complaint  Patient presents with  . Nasal Congestion  . CHEST CONGESTION    HPI Jennifer Ellison is a 32 year old female with a past medical history of Hypoglycemia. She is here for a sick visit.   Current Status: Since her last office visit, she has been having sinus problems for over a week now, and symptoms have worsened. She associated with Allergies, but has not had any relief. She has tried Mucinex for a few days, but it has not been effective.   She denies fevers, chills, fatigue, recent infections, weight loss, and night sweats. She has not had any headaches, visual changes, dizziness, and falls. No chest pain, heart palpitations, cough and shortness of breath reported. No reports of GI problems such as nausea, vomiting, diarrhea, and constipation. She has no reports of blood in stools, dysuria and hematuria. No depression or anxiety, and denies suicidal ideations, homicidal ideations, or auditory hallucinations. She denies pain today.   Past Medical History:  Diagnosis Date  . Hypoglycemia     Family History  Problem Relation Age of Onset  . Asthma Brother   . Diabetes Maternal Grandmother      Social History   Socioeconomic History  . Marital status: Single    Spouse name: Not on file  . Number of children: Not on file  . Years of education: Not on file  . Highest education level: Not on file  Occupational History  . Not on file  Social Needs  . Financial resource strain: Not on file  . Food insecurity:    Worry: Not on file    Inability: Not on file  . Transportation needs:    Medical: Not on file    Non-medical: Not on file  Tobacco Use  . Smoking status: Never Smoker  . Smokeless tobacco: Never Used  Substance and Sexual Activity  . Alcohol use: Yes  . Drug use: No  . Sexual activity: Not Currently    Birth control/protection: IUD   Lifestyle  . Physical activity:    Days per week: Not on file    Minutes per session: Not on file  . Stress: Not on file  Relationships  . Social connections:    Talks on phone: Not on file    Gets together: Not on file    Attends religious service: Not on file    Active member of club or organization: Not on file    Attends meetings of clubs or organizations: Not on file    Relationship status: Not on file  . Intimate partner violence:    Fear of current or ex partner: Not on file    Emotionally abused: Not on file    Physically abused: Not on file    Forced sexual activity: Not on file  Other Topics Concern  . Not on file  Social History Narrative  . Not on file    Past Surgical History:  Procedure Laterality Date  . DILATION AND CURETTAGE OF UTERUS  2008  . OVARY SURGERY  2012   left ovary removed; cyst related     There is no immunization history on file for this patient.  Current Meds  Medication Sig  . cetirizine (ZYRTEC) 10 MG tablet Take 1 tablet (10 mg total) by mouth daily.    Allergies  Allergen Reactions  . Blueberry  Flavor Swelling    BP 140/80 (BP Location: Left Arm, Patient Position: Sitting, Cuff Size: Large)   Pulse 84   Temp 98 F (36.7 C) (Oral)   Ht 5' 2.25" (1.581 m)   Wt 247 lb (112 kg)   SpO2 99%   BMI 44.81 kg/m   Review of Systems  Constitutional: Negative.   HENT: Positive for congestion (sinus ) and sinus pain.   Eyes: Negative.   Respiratory: Negative.   Cardiovascular: Negative.   Gastrointestinal: Negative.   Genitourinary: Negative.   Musculoskeletal: Negative.   Neurological: Negative.   Psychiatric/Behavioral: Negative.    Objective:   Physical Exam  Constitutional: She is oriented to person, place, and time. She appears well-developed and well-nourished.  HENT:  Right Ear: External ear normal.  Left Ear: External ear normal.  Throat erythema.  Nose has swollen middle turbinates.   Cardiovascular: Normal  rate, regular rhythm and normal heart sounds.  Pulmonary/Chest: Effort normal and breath sounds normal.  Abdominal: Soft. Bowel sounds are normal.  Neurological: She is alert and oriented to person, place, and time.  Nursing note and vitals reviewed.  Assessment & Plan:   1. Acute sinusitis, recurrence not specified, unspecified location - cetirizine (ZYRTEC) 10 MG tablet; Take 1 tablet (10 mg total) by mouth daily.  Dispense: 30 tablet; Refill: 11 - albuterol (PROVENTIL HFA;VENTOLIN HFA) 108 (90 Base) MCG/ACT inhaler; Inhale 2 puffs into the lungs every 6 (six) hours as needed for wheezing or shortness of breath.  Dispense: 1 Inhaler; Refill: 0 - sodium chloride (OCEAN) 0.65 % SOLN nasal spray; Place 1 spray into both nostrils as needed for congestion.  Dispense: 1 Bottle; Refill: 3 - amoxicillin-clavulanate (AUGMENTIN) 875-125 MG tablet; Take 1 tablet by mouth 2 (two) times daily for 10 days.  Dispense: 20 tablet; Refill: 0  2. Seasonal allergies - cetirizine (ZYRTEC) 10 MG tablet; Take 1 tablet (10 mg total) by mouth daily.  Dispense: 30 tablet; Refill: 11  3. Follow up She will keep follow up appointment scheduled for 07/2018.  Meds ordered this encounter  Medications  . cetirizine (ZYRTEC) 10 MG tablet    Sig: Take 1 tablet (10 mg total) by mouth daily.    Dispense:  30 tablet    Refill:  11  . albuterol (PROVENTIL HFA;VENTOLIN HFA) 108 (90 Base) MCG/ACT inhaler    Sig: Inhale 2 puffs into the lungs every 6 (six) hours as needed for wheezing or shortness of breath.    Dispense:  1 Inhaler    Refill:  0  . sodium chloride (OCEAN) 0.65 % SOLN nasal spray    Sig: Place 1 spray into both nostrils as needed for congestion.    Dispense:  1 Bottle    Refill:  3  . amoxicillin-clavulanate (AUGMENTIN) 875-125 MG tablet    Sig: Take 1 tablet by mouth 2 (two) times daily for 10 days.    Dispense:  20 tablet    Refill:  0   Raliegh Ip,  MSN, FNP-C [facility] [address] [place], [place] [address] [phone]

## 2017-11-29 ENCOUNTER — Ambulatory Visit: Payer: Medicaid Other | Admitting: Obstetrics

## 2018-02-28 ENCOUNTER — Ambulatory Visit: Payer: Medicaid Other | Admitting: Family Medicine

## 2018-03-10 ENCOUNTER — Encounter: Payer: Self-pay | Admitting: Family Medicine

## 2018-03-10 ENCOUNTER — Ambulatory Visit (INDEPENDENT_AMBULATORY_CARE_PROVIDER_SITE_OTHER): Payer: Self-pay | Admitting: Family Medicine

## 2018-03-10 VITALS — BP 130/72 | HR 74 | Temp 98.1°F | Ht 62.25 in | Wt 248.0 lb

## 2018-03-10 DIAGNOSIS — Z6841 Body Mass Index (BMI) 40.0 and over, adult: Secondary | ICD-10-CM

## 2018-03-10 DIAGNOSIS — F419 Anxiety disorder, unspecified: Secondary | ICD-10-CM

## 2018-03-10 DIAGNOSIS — E66813 Obesity, class 3: Secondary | ICD-10-CM

## 2018-03-10 DIAGNOSIS — Z09 Encounter for follow-up examination after completed treatment for conditions other than malignant neoplasm: Secondary | ICD-10-CM

## 2018-03-10 DIAGNOSIS — M79676 Pain in unspecified toe(s): Secondary | ICD-10-CM

## 2018-03-10 NOTE — Progress Notes (Signed)
Sick Visit  Subjective:    Patient ID: Jennifer KinsmanLatasha Ellison, female    DOB: [birth year], 32 y.o.   MRN: [id number]   Chief Complaint  Patient presents with  . Toe Pain    Pinky toe    HPI  Ms. Jennifer Ellison is a 32 year old female with a past medical history of Hypoglycemia. She is here today for a sick visit.   Current Status: Since her last office visit, she reports right 5th toe callus with pain and tenderness X 3 weeks. She has no used any medications for relief. She has had IUD X 5 years and noticing 'pinkish' discharge and increasing menstrual symptoms X 2 weeks. She has follow up appointment with GYN on 03/28/2018. No reports of GI problems such as nausea, vomiting, diarrhea, and constipation. She has no reports of blood in stools, dysuria and hematuria.  She denies fevers, chills, fatigue, recent infections, weight loss, and night sweats. She has not had any headaches, visual changes, dizziness, and falls. No chest pain, heart palpitations, cough and shortness of breath reported.  No depression or anxiety reported. She denies pain today.   Review of Systems  Constitutional: Negative.   HENT: Negative.   Eyes: Negative.   Respiratory: Negative.   Cardiovascular: Negative.   Gastrointestinal: Positive for abdominal pain.  Endocrine: Negative.   Genitourinary: Negative.   Musculoskeletal:       Right baby toe pain  Skin: Negative.   Allergic/Immunologic: Negative.   Neurological: Negative.   Hematological: Negative.   Psychiatric/Behavioral: Negative.    Objective:   Physical Exam Vitals signs and nursing note reviewed.  Constitutional:      Appearance: Normal appearance.  HENT:     Head: Normocephalic and atraumatic.     Right Ear: Tympanic membrane, ear canal and external ear normal.     Left Ear: Tympanic membrane, ear canal and external ear normal.     Nose: Nose normal.     Mouth/Throat:     Mouth: Mucous membranes are moist.     Pharynx: Oropharynx is clear.  Eyes:   Extraocular Movements: Extraocular movements intact.     Conjunctiva/sclera: Conjunctivae normal.     Pupils: Pupils are equal, round, and reactive to light.  Neck:     Musculoskeletal: Normal range of motion and neck supple.  Cardiovascular:     Rate and Rhythm: Normal rate and regular rhythm.     Pulses: Normal pulses.     Heart sounds: Normal heart sounds.  Pulmonary:     Effort: Pulmonary effort is normal.     Breath sounds: Normal breath sounds.  Abdominal:     General: Bowel sounds are normal.     Palpations: Abdomen is soft.  Musculoskeletal: Normal range of motion.  Skin:    General: Skin is warm and dry.  Neurological:     General: No focal deficit present.     Mental Status: She is alert and oriented to person, place, and time.  Psychiatric:        Mood and Affect: Mood normal.        Behavior: Behavior normal.        Thought Content: Thought content normal.        Judgment: Judgment normal.    Assessment & Plan:   1. Pain of fifth toe Callus on side of fifth toe after a day of wearing tight-fitting shoes. She will soak feet with epsom salt and file callus down.   2. Class 3 severe obesity  due to excess calories without serious comorbidity with body mass index (BMI) of 45.0 to 49.9 in adult [facility](HCC) Body mass index is 45 kg/m. Goal BMI  is <30. Encouraged efforts to reduce weight include engaging in physical activity as tolerated with goal of 150 minutes per week. Improve dietary choices and eat a meal regimen consistent with a Mediterranean or DASH diet. Reduce simple carbohydrates. Do not skip meals and eat healthy snacks throughout the day to avoid over-eating at dinner. Set a goal weight loss that is achievable for you. - Referral to Nutrition and Diabetes Services  3. Anxiety Stable.   4. Follow up She will continue follow up on 07/2018.  No orders of the defined types were placed in this encounter.   Referral Orders     Referral to Nutrition and Diabetes  Services   Raliegh IpNatalie Demarlo Riojas,  MSN, FNP-C [facility] [address]  [place], [place] [id number] [phone]

## 2018-03-28 ENCOUNTER — Ambulatory Visit (INDEPENDENT_AMBULATORY_CARE_PROVIDER_SITE_OTHER): Payer: Medicaid Other | Admitting: Obstetrics

## 2018-03-28 ENCOUNTER — Encounter: Payer: Self-pay | Admitting: Obstetrics

## 2018-03-28 VITALS — BP 134/89 | HR 97 | Wt 248.0 lb

## 2018-03-28 DIAGNOSIS — Z30432 Encounter for removal of intrauterine contraceptive device: Secondary | ICD-10-CM | POA: Diagnosis not present

## 2018-03-28 DIAGNOSIS — Z3009 Encounter for other general counseling and advice on contraception: Secondary | ICD-10-CM

## 2018-03-28 DIAGNOSIS — Z30011 Encounter for initial prescription of contraceptive pills: Secondary | ICD-10-CM

## 2018-03-28 MED ORDER — LEVONORGEST-ETH ESTRADIOL-IRON 0.1-20 MG-MCG(21) PO TABS: 1.0000 | ORAL_TABLET | Freq: Every day | ORAL | 11 refills | Status: DC

## 2018-03-28 NOTE — Progress Notes (Signed)
    GYNECOLOGY OFFICE PROCEDURE NOTE  Jennifer Ellison is a 33 y.o. [id number] here for Mirena IUD removal. No GYN concerns.  Last pap smear was on 10/2016 and was normal.  IUD Removal  Patient identified, informed consent performed, consent signed.  Patient was in the dorsal lithotomy position, normal external genitalia was noted.  A speculum was placed in the patient's vagina, normal discharge was noted, no lesions. The cervix was visualized, no lesions, no abnormal discharge.  The strings of the IUD were grasped and pulled using ring forceps. The IUD was removed in its entirety.  Patient tolerated the procedure well.    Patient will use OCP's for contraception.  Routine preventative health maintenance measures emphasized.   Brock Bad, MD, FACOG Obstetrician & Gynecologist, [facility] for [facility], [facility] 03-28-2018

## 2018-03-28 NOTE — Patient Instructions (Signed)
Oral Contraception Information  Oral contraceptive pills (OCPs) are medicines taken to prevent pregnancy. OCPs are taken by mouth, and they work by:  · Preventing the ovaries from releasing eggs.  · Thickening mucus in the lower part of the uterus (cervix), which prevents sperm from entering the uterus.  · Thinning the lining of the uterus (endometrium), which prevents a fertilized egg from attaching to the endometrium.  OCPs are highly effective when taken exactly as prescribed. However, OCPs do not prevent STIs (sexually transmitted infections). Safe sex practices, such as using condoms while on an OCP, can help prevent STIs.  Before starting OCPs  Before you start taking OCPs, you may have a physical exam, blood test, and Pap test. However, you are not required to have a pelvic exam in order to be prescribed OCPs. Your health care provider will make sure you are a good candidate for oral contraception. OCPs are not a good option for certain women, including women who smoke and are older than 35 years, and women with a medical history of high blood pressure, deep vein thrombosis, pulmonary embolism, stroke, cardiovascular disease, or peripheral vascular disease.  Discuss with your health care provider the possible side effects of the OCP you may be prescribed. When you start an OCP, be aware that it can take 2-3 months for your body to adjust to changes in hormone levels.  Follow instructions from your health care provider about how to start taking your first cycle of OCPs. Depending on when you start the pill, you may need to use a backup form of birth control, such as condoms, during the first week. Make sure you know what steps to take if you ever forget to take the pill.  Types of oral contraception    The most common types of birth control pills contain the hormones estrogen and progestin (synthetic progesterone) or progestin only.  The combination pill  This type of pill contains estrogen and progestin  hormones. Combination pills often come in packs of 21, 28, or 91 pills. For each pack, the last 7 pills may not contain hormones, which means you may stop taking the pills for 7 days. Menstrual bleeding occurs during the week that you do not take the pills or that you take the pills with no hormones in them.  The minipill  This type of pill contains the progestin hormone only. It comes in packs of 28 pills. All 28 pills contain the hormone. You take the pill every day. It is very important to take the pill at the same time each day.  Advantages of oral contraceptive pills  · Provides reliable and continuous contraception if taken as instructed.  · May treat or decrease symptoms of:  ? Menstrual period cramps.  ? Irregular menstrual cycle or bleeding.  ? Heavy menstrual flow.  ? Abnormal uterine bleeding.  ? Acne, depending on the type of pill.  ? Polycystic ovarian syndrome.  ? Endometriosis.  ? Iron deficiency anemia.  ? Premenstrual symptoms, including premenstrual dysphoric disorder.  · May reduce the risk of endometrial and ovarian cancer.  · Can be used as emergency contraception.  · Prevents mislocated (ectopic) pregnancies and infections of the fallopian tubes.  Things that can make oral contraceptive pills less effective  OCPs can be less effective if:  · You forget to take the pill at the same time every day. This is especially important when taking the minipill.  · You have a stomach or intestinal disease that reduces   your body's ability to absorb the pill.  · You take OCPs with other medicines that make OCPs less effective, such as antibiotics, certain HIV medicines, and some seizure medicines.  · You take expired OCPs.  · You forget to restart the pill on day 7, if using the packs of 21 pills.  Risks associated with oral contraceptive pills  Oral contraceptive pills can sometimes cause side effects, such as:  · Headache.  · Depression.  · Trouble sleeping.  · Nausea and vomiting.  · Breast  tenderness.  · Irregular bleeding or spotting during the first several months.  · Bloating or fluid retention.  · Increase in blood pressure.  Combination pills are also associated with a small increase in the risk of:  · Blood clots.  · Heart attack.  · Stroke.  Summary  · Oral contraceptive pills are medicines taken by mouth to prevent pregnancy. They are highly effective when taken exactly as prescribed.  · The most common types of birth control pills contain the hormones estrogen and progestin (synthetic progesterone) or progestin only.  · Before you start taking the pill, you may have a physical exam, blood test, and Pap test. Your health care provider will make sure you are a good candidate for oral contraception.  · The combination pill may come in a 21-day pack, a 28-day pack, or a 91-day pack. The minipill contains the progesterone hormone only and comes in packs of 28 pills.  · Oral contraceptive pills can sometimes cause side effects, such as headache, nausea, breast tenderness, or irregular bleeding.  This information is not intended to replace advice given to you by your health care provider. Make sure you discuss any questions you have with your health care provider.  Document Released: 05/29/2002 Document Revised: 06/01/2016 Document Reviewed: 06/01/2016  Elsevier Interactive Patient Education © 2019 Elsevier Inc.    Oral Contraception Use  Oral contraceptive pills (OCPs) are medicines that you take to prevent pregnancy. OCPs work by:  · Preventing the ovaries from releasing eggs.  · Thickening mucus in the lower part of the uterus (cervix), which prevents sperm from entering the uterus.  · Thinning the lining of the uterus (endometrium), which prevents a fertilized egg from attaching to the endometrium.  OCPs are highly effective when taken exactly as prescribed. However, OCPs do not prevent sexually transmitted infections (STIs). Safe sex practices, such as using condoms while on an OCP, can help  prevent STIs.  Before taking OCPs, you may have a physical exam, blood test, and Pap test. A Pap test involves taking a sample of cells from your cervix to check for cancer. Discuss with your health care provider the possible side effects of the OCP you may be prescribed. When you start an OCP, be aware that it can take 2-3 months for your body to adjust to changes in hormone levels.  How to take oral contraceptive pills  Follow instructions from your health care provider about how to start taking your first cycle of OCPs. Your health care provider may recommend that you:  · Start the pill on day 1 of your menstrual period. If you start at this time, you will not need any backup form of birth control (contraception), such as condoms.  · Start the pill on the first Sunday after your menstrual period or on the day you get your prescription. In these cases, you will need to use backup contraception for the first week.  · Start the pill at any   time of your cycle.  ? If you take the pill within 5 days of the start of your period, you will not need a backup form of contraception.  ? If you start at any other time of your menstrual cycle, you will need to use another form of contraception for 7 days. If your OCP is the type called a minipill, it will protect you from pregnancy after taking it for 2 days (48 hours), and you can stop using backup contraception after that time.  After you have started taking OCPs:  · If you forget to take 1 pill, take it as soon as you remember. Take the next pill at the regular time.  · If you miss 2 or more pills, call your health care provider. Different pills have different instructions for missed doses. Use backup birth control until your next menstrual period starts.  · If you use a 28-day pack that contains inactive pills and you miss 1 of the last 7 pills (pills with no hormones), throw away the rest of the non-hormone pills and start a new pill pack.  No matter which day you start  the OCP, you will always start a new pack on that same day of the week. Have an extra pack of OCPs and a backup contraceptive method available in case you miss some pills or lose your OCP pack.  Follow these instructions at home:  · Do not use any products that contain nicotine or tobacco, such as cigarettes and e-cigarettes. If you need help quitting, ask your health care provider.  · Always use a condom to protect against STIs. OCPs do not protect against STIs.  · Use a calendar to mark the days of your menstrual period.  · Read the information and directions that came with your OCP. Talk to your health care provider if you have questions.  Contact a health care provider if:  · You develop nausea and vomiting.  · You have abnormal vaginal discharge or bleeding.  · You develop a rash.  · You miss your menstrual period. Depending on the type of OCP you are taking, this may be a sign of pregnancy. Ask your health care provider for more information.  · You are losing your hair.  · You need treatment for mood swings or depression.  · You get dizzy when taking the OCP.  · You develop acne after taking the OCP.  · You become pregnant or think you may be pregnant.  · You have diarrhea, constipation, and abdominal pain or cramps.  · You miss 2 or more pills.  Get help right away if:  · You develop chest pain.  · You develop shortness of breath.  · You have an uncontrolled or severe headache.  · You develop numbness or slurred speech.  · You develop visual or speech problems.  · You develop pain, redness, and swelling in your legs.  · You develop weakness or numbness in your arms or legs.  Summary  · Oral contraceptive pills (OCPs) are medicines that you take to prevent pregnancy.  · OCPs do not prevent sexually transmitted infections (STIs). Always use a condom to protect against STIs.  · When you start an OCP, be aware that it can take 2-3 months for your body to adjust to changes in hormone levels.  · Read all the  information and directions that come with your OCP.  This information is not intended to replace advice given to you by your health   care provider. Make sure you discuss any questions you have with your health care provider.  Document Released: 02/25/2011 Document Revised: 04/19/2016 Document Reviewed: 04/19/2016  Elsevier Interactive Patient Education © 2019 Elsevier Inc.

## 2018-04-18 ENCOUNTER — Ambulatory Visit: Payer: Medicaid Other | Admitting: Dietician

## 2018-05-10 ENCOUNTER — Telehealth: Payer: Self-pay

## 2018-05-10 NOTE — Telephone Encounter (Addendum)
TC to pt states Balcoltra was $200. Pt has MCD.  TC to pharmacy. They didn't have MCD on file in order to run it through. Rx now covered and available for pt to pick.

## 2018-08-17 ENCOUNTER — Telehealth: Payer: Self-pay

## 2018-08-17 NOTE — Telephone Encounter (Signed)
Tried to contact patient but number not working.

## 2018-08-18 ENCOUNTER — Ambulatory Visit: Payer: Medicaid Other | Admitting: Family Medicine

## 2018-09-20 DIAGNOSIS — Z3490 Encounter for supervision of normal pregnancy, unspecified, unspecified trimester: Secondary | ICD-10-CM

## 2018-09-20 HISTORY — DX: Encounter for supervision of normal pregnancy, unspecified, unspecified trimester: Z34.90

## 2018-10-10 ENCOUNTER — Telehealth: Payer: Self-pay | Admitting: Obstetrics

## 2018-10-10 ENCOUNTER — Telehealth: Payer: Self-pay

## 2018-10-10 ENCOUNTER — Other Ambulatory Visit: Payer: Self-pay | Admitting: Obstetrics

## 2018-10-10 NOTE — Telephone Encounter (Signed)
Patient called back to check on the status of her earlier Rx request.  I called her pharmacy directly and confirmed that the Orlena Sheldon is covered at 100% thru her Medicaid.  She must have been quoted the price without insurance prior.    Requested pharmacist to fill the prescription.  It was not in stock, will be ordered and ready for pick up on 10/11/18 am.   Notified patient that her original Rx is covered and can be picked up tomorrow morning.     May disregard earlier request for alternate OCP.  Cost is covered.

## 2018-10-10 NOTE — Telephone Encounter (Signed)
Patient called requesting an alternate OCP be sent to her pharmacy.  The one she was prescribed Balcoltra is > $200 a month.  She is a requesting a comparable OCP that is more cost effective.     She uses the [facility] in [place].   Routing to Dr. Jodi Mourning for medication order.

## 2018-10-12 NOTE — Telephone Encounter (Signed)
Spoke with patient and she states that she was able to get her birth control already

## 2018-10-25 ENCOUNTER — Ambulatory Visit: Payer: Medicaid Other | Admitting: Family Medicine

## 2018-11-24 ENCOUNTER — Encounter (HOSPITAL_COMMUNITY): Payer: Self-pay

## 2018-11-24 ENCOUNTER — Emergency Department (HOSPITAL_COMMUNITY): Payer: Medicaid Other

## 2018-11-24 ENCOUNTER — Emergency Department (HOSPITAL_COMMUNITY)
Admission: EM | Admit: 2018-11-24 | Discharge: 2018-11-24 | Disposition: A | Discharge status: Home / Self Care | Payer: Medicaid Other | Attending: Emergency Medicine | Admitting: Emergency Medicine

## 2018-11-24 ENCOUNTER — Other Ambulatory Visit: Payer: Self-pay

## 2018-11-24 DIAGNOSIS — O200 Threatened abortion: Secondary | ICD-10-CM | POA: Diagnosis not present

## 2018-11-24 DIAGNOSIS — Z3A01 Less than 8 weeks gestation of pregnancy: Secondary | ICD-10-CM | POA: Diagnosis not present

## 2018-11-24 DIAGNOSIS — Z79899 Other long term (current) drug therapy: Secondary | ICD-10-CM | POA: Insufficient documentation

## 2018-11-24 LAB — CBC
HCT: 41.1 % (ref 36.0–46.0)
Hemoglobin: 12.8 g/dL (ref 12.0–15.0)
MCH: 28.8 pg (ref 26.0–34.0)
MCHC: 31.1 g/dL (ref 30.0–36.0)
MCV: 92.6 fL (ref 80.0–100.0)
Platelets: 324 10*3/uL (ref 150–400)
RBC: 4.44 MIL/uL (ref 3.87–5.11)
RDW: 13.5 % (ref 11.5–15.5)
WBC: 10.5 10*3/uL (ref 4.0–10.5)
nRBC: 0 % (ref 0.0–0.2)

## 2018-11-24 LAB — COMPREHENSIVE METABOLIC PANEL
ALT: 39 U/L (ref 0–44)
AST: 36 U/L (ref 15–41)
Albumin: 3.9 g/dL (ref 3.5–5.0)
Alkaline Phosphatase: 70 U/L (ref 38–126)
Anion gap: 9 (ref 5–15)
BUN: 9 mg/dL (ref 6–20)
CO2: 24 mmol/L (ref 22–32)
Calcium: 9.2 mg/dL (ref 8.9–10.3)
Chloride: 105 mmol/L (ref 98–111)
Creatinine, Ser: 0.61 mg/dL (ref 0.44–1.00)
GFR calc Af Amer: >60 mL/min (ref >60)
GFR calc non Af Amer: >60 mL/min (ref >60)
Glucose, Bld: 103 mg/dL — ABNORMAL HIGH (ref 70–99)
Potassium: 3.4 mmol/L — ABNORMAL LOW (ref 3.5–5.1)
Sodium: 138 mmol/L (ref 135–145)
Total Bilirubin: 0.2 mg/dL — ABNORMAL LOW (ref 0.3–1.2)
Total Protein: 7.5 g/dL (ref 6.5–8.1)

## 2018-11-24 LAB — URINALYSIS, ROUTINE W REFLEX MICROSCOPIC
Bilirubin Urine: NEGATIVE
Glucose, UA: NEGATIVE mg/dL
Hgb urine dipstick: NEGATIVE
Ketones, ur: NEGATIVE mg/dL
Nitrite: NEGATIVE
Protein, ur: NEGATIVE mg/dL
Specific Gravity, Urine: 1.015 (ref 1.005–1.030)
pH: 6 (ref 5.0–8.0)

## 2018-11-24 LAB — LIPASE, BLOOD: Lipase: 29 U/L (ref 11–51)

## 2018-11-24 LAB — HCG, QUANTITATIVE, PREGNANCY: hCG, Beta Chain, Quant, S: 16188 m[IU]/mL — ABNORMAL HIGH (ref <5)

## 2018-11-24 LAB — I-STAT BETA HCG BLOOD, ED (MC, WL, AP ONLY): I-stat hCG, quantitative: >2000 m[IU]/mL — ABNORMAL HIGH (ref <5)

## 2018-11-24 MED ORDER — SODIUM CHLORIDE 0.9% FLUSH: 3.0000 mL | Freq: Once | INTRAVENOUS | Status: DC

## 2018-11-24 NOTE — ED Provider Notes (Signed)
[facility]-EMERGENCY DEPT Provider Note   CSN: [id number] Arrival date & time: 11/24/18  1539     History   Chief Complaint Chief Complaint  Patient presents with   Abdominal Pain    HPI Jennifer Ellison is a 33 y.o. female.     HPI Pt is G3p2.  Pt has been having lower abdominal pain and cramping for the last week or so.  No vaginal bleeding.   She has been nauseated but no vomiting.  She decided to take a home pregnancy test and it was positive.  She called her OB GYN and was instructed to come to the ED.    Past Medical History:  Diagnosis Date   Anxiety    Hypoglycemia    Obesity     There are no active problems to display for this patient.   Past Surgical History:  Procedure Laterality Date   DILATION AND CURETTAGE OF UTERUS  2008   OVARY SURGERY  2012   left ovary removed; cyst related     OB History    Gravida  4   Para  2   Term  2   Preterm      AB  2   Living  2     SAB  2   TAB      Ectopic      Multiple      Live Births  2            Home Medications    Prior to Admission medications   Medication Sig Start Date End Date Taking? Authorizing Provider  cetirizine (ZYRTEC) 10 MG tablet Take 1 tablet (10 mg total) by mouth daily. 11/25/17  Yes Kallie Locks, FNP  albuterol (PROVENTIL HFA;VENTOLIN HFA) 108 (90 Base) MCG/ACT inhaler Inhale 2 puffs into the lungs every 6 (six) hours as needed for wheezing or shortness of breath. Patient not taking: Reported on 11/24/2018 11/25/17   Kallie Locks, FNP  cyclobenzaprine (FLEXERIL) 10 MG tablet Take 1 tablet (10 mg total) by mouth 3 (three) times daily as needed for muscle spasms. Patient not taking: Reported on 11/10/2017 10/24/17   Kallie Locks, FNP  Levonorgest-Eth Estrad-Fe Bisg (BALCOLTRA) 0.1-20 MG-MCG(21) TABS Take 1 tablet by mouth daily. Patient not taking: Reported on 11/24/2018 03/28/18   Brock Bad, MD  meloxicam (MOBIC) 15 MG tablet Take 1  tablet (15 mg total) by mouth daily. Patient not taking: Reported on 08/17/2017 10/27/16   Bing Neighbors, FNP  sodium chloride (OCEAN) 0.65 % SOLN nasal spray Place 1 spray into both nostrils as needed for congestion. Patient not taking: Reported on 03/10/2018 11/25/17   Kallie Locks, FNP    Family History Family History  Problem Relation Age of Onset   Asthma Brother    Diabetes Maternal Grandmother     Social History Social History   Tobacco Use   Smoking status: Never Smoker   Smokeless tobacco: Never Used  Substance Use Topics   Alcohol use: Yes   Drug use: No     Allergies   Blueberry flavor   Review of Systems Review of Systems  All other systems reviewed and are negative.    Physical Exam Updated Vital Signs BP (!) 96/59 (BP Location: Right Arm)    Pulse 100    Temp 98.2 F (36.8 C) (Oral)    Resp 18    Ht 1.575 m (5\' 2" )    Wt 121.6 kg  LMP 10/09/2018 (Approximate)    SpO2 100%    BMI 49.02 kg/m   Physical Exam Vitals signs and nursing note reviewed.  Constitutional:      General: She is not in acute distress.    Appearance: She is well-developed.  HENT:     Head: Normocephalic and atraumatic.     Right Ear: External ear normal.     Left Ear: External ear normal.  Eyes:     General: No scleral icterus.       Right eye: No discharge.        Left eye: No discharge.     Conjunctiva/sclera: Conjunctivae normal.  Neck:     Musculoskeletal: Neck supple.     Trachea: No tracheal deviation.  Cardiovascular:     Rate and Rhythm: Normal rate and regular rhythm.  Pulmonary:     Effort: Pulmonary effort is normal. No respiratory distress.     Breath sounds: Normal breath sounds. No stridor. No wheezing or rales.  Abdominal:     General: Bowel sounds are normal. There is no distension.     Palpations: Abdomen is soft.     Tenderness: There is no abdominal tenderness. There is no guarding or rebound.  Genitourinary:    Exam position: Supine.       Labia:        Right: No rash, tenderness or lesion.        Left: No rash, tenderness or lesion.      Vagina: Normal.     Cervix: No cervical motion tenderness or discharge.     Adnexa:        Right: No mass, tenderness or fullness.         Left: No mass, tenderness or fullness.    Musculoskeletal:        General: No tenderness.  Skin:    General: Skin is warm and dry.     Findings: No rash.  Neurological:     Mental Status: She is alert.     Cranial Nerves: No cranial nerve deficit (no facial droop, extraocular movements intact, no slurred speech).     Sensory: No sensory deficit.     Motor: No abnormal muscle tone or seizure activity.     Coordination: Coordination normal.      ED Treatments / Results  Labs (all labs ordered are listed, but only abnormal results are displayed) Labs Reviewed  COMPREHENSIVE METABOLIC PANEL - Abnormal; Notable for the following components:      Result Value   Potassium 3.4 (*)    Glucose, Bld 103 (*)    Total Bilirubin 0.2 (*)    All other components within normal limits  URINALYSIS, ROUTINE W REFLEX MICROSCOPIC - Abnormal; Notable for the following components:   Leukocytes,Ua MODERATE (*)    Bacteria, UA RARE (*)    All other components within normal limits  HCG, QUANTITATIVE, PREGNANCY - Abnormal; Notable for the following components:   hCG, Beta Chain, Quant, S 16,188 (*)    All other components within normal limits  I-STAT BETA HCG BLOOD, ED (MC, WL, AP ONLY) - Abnormal; Notable for the following components:   I-stat hCG, quantitative >2,000.0 (*)    All other components within normal limits  LIPASE, BLOOD  CBC  RPR  HIV ANTIBODY (ROUTINE TESTING W REFLEX)  GC/CHLAMYDIA PROBE AMP ([place]) NOT AT [facility]    EKG None  Radiology US Ob Comp < 14 Wks  Result Date: 11/24/2018 CLINICAL DATA:  Abdominal  pain EXAM: OBSTETRIC <14 WK US AND TRANSVAGINAL OB US TECHNIQUE: Both transabdominal and transvaginal ultrasound examinations  were performed for complete evaluation of the gestation as well as the maternal uterus, adnexal regions, and pelvic cul-de-sac. Transvaginal technique was performed to assess early pregnancy. COMPARISON:  None. FINDINGS: Intrauterine gestational sac: Probable single intrauterine gestational sac. Contains scattered internal echoes. Yolk sac:  Not seen Embryo:  Not seen MSD: 11.6 mm   6 w   0 d Maternal uterus/adnexae: Patient gives history of prior left ovary removal. Right ovary within normal limits and measures 3.9 x 2 x 2 cm with volume of 8.1 mL. IMPRESSION: Probable early intrauterine gestational sac, but no yolk sac, fetal pole, or cardiac activity yet visualized. Recommend follow-up quantitative B-HCG levels and follow-up US in 14 days to assess viability. This recommendation follows SRU consensus guidelines: Diagnostic Criteria for Nonviable Pregnancy Early in the First Trimester. Malva Limes Engl J Med 2013; 629:5284-13; 369:1443-51. Electronically Signed   By: Jasmine PangKim  Fujinaga M.D.   On: 11/24/2018 19:36   [place] Ob Transvaginal  Result Date: 11/24/2018 CLINICAL DATA:  Abdominal pain EXAM: OBSTETRIC <14 WK US AND TRANSVAGINAL OB US TECHNIQUE: Both transabdominal and transvaginal ultrasound examinations were performed for complete evaluation of the gestation as well as the maternal uterus, adnexal regions, and pelvic cul-de-sac. Transvaginal technique was performed to assess early pregnancy. COMPARISON:  None. FINDINGS: Intrauterine gestational sac: Probable single intrauterine gestational sac. Contains scattered internal echoes. Yolk sac:  Not seen Embryo:  Not seen MSD: 11.6 mm   6 w   0 d Maternal uterus/adnexae: Patient gives history of prior left ovary removal. Right ovary within normal limits and measures 3.9 x 2 x 2 cm with volume of 8.1 mL. IMPRESSION: Probable early intrauterine gestational sac, but no yolk sac, fetal pole, or cardiac activity yet visualized. Recommend follow-up quantitative B-HCG levels and follow-up US in  14 days to assess viability. This recommendation follows SRU consensus guidelines: Diagnostic Criteria for Nonviable Pregnancy Early in the First Trimester. Malva Limes Engl J Med 2013; 244:0102-72; 369:1443-51. Electronically Signed   By: Jasmine PangKim  Fujinaga M.D.   On: 11/24/2018 19:36    Procedures Procedures (including critical care time)  Medications Ordered in ED Medications - No data to display   Initial Impression / Assessment and Plan / ED Course  I have reviewed the triage vital signs and the nursing notes.  Pertinent labs & imaging results that were available during my care of the patient were reviewed by me and considered in my medical decision making (see chart for details).   Patient presented with complaints of abdominal pain in early pregnancy.  Differential included ectopic pregnancy, threatened miscarriage or normal early intrauterine pregnancy.  Patient's ultrasound does not show a yolk sac or fetal pole.  There is a probable early intrauterine gestational sac.  Patient will require follow-up ultrasound in 14 days to assess viability.  Counseled the patient to follow-up with her OB/GYN doctor.  Warning signs and precautions discussed.  Final Clinical Impressions(s) / ED Diagnoses   Final diagnoses:  Threatened miscarriage    ED Discharge Orders    None       Linwood DibblesKnapp, Eudell Mcphee, MD 11/24/18 2023

## 2018-11-24 NOTE — ED Triage Notes (Signed)
Pt presents with c/o abdominal pain that started last week. Pt reported that she took a pregnancy test this week and it was positive. Pt reports she believes she may be about [redacted] weeks pregnant but is unsure as to how far along she is. Pt's OB reported that she wanted her to come to the ER for an ultrasound.

## 2018-11-24 NOTE — Discharge Instructions (Signed)
Ultrasound suggests you are probably around 6 weeks.  An early gestational sac was seen however there is no evidence of fetal pole or yolk sac.  A follow-up ultrasound is necessary.  Please contact your OB/GYN doctor to have that arranged.  Return to [facility] women's and children's section if you start having increasing pain or bleeding.

## 2018-11-24 NOTE — ED Notes (Signed)
Pt was verbalized discharge instructions. Pt had no further questions at this time. NAD. 

## 2018-11-25 LAB — HIV ANTIBODY (ROUTINE TESTING W REFLEX): HIV Screen 4th Generation wRfx: NONREACTIVE

## 2018-11-25 LAB — RPR: RPR Ser Ql: NONREACTIVE

## 2018-11-28 ENCOUNTER — Ambulatory Visit: Payer: Medicaid Other | Admitting: Family Medicine

## 2018-11-29 LAB — GC/CHLAMYDIA PROBE AMP (~~LOC~~) NOT AT ARMC
Chlamydia: NEGATIVE
Neisseria Gonorrhea: NEGATIVE

## 2018-12-05 ENCOUNTER — Encounter: Payer: Self-pay | Admitting: Family Medicine

## 2018-12-05 ENCOUNTER — Other Ambulatory Visit: Payer: Self-pay

## 2018-12-05 ENCOUNTER — Ambulatory Visit (INDEPENDENT_AMBULATORY_CARE_PROVIDER_SITE_OTHER): Payer: Medicaid Other | Admitting: Family Medicine

## 2018-12-05 VITALS — BP 124/72 | HR 94 | Ht 62.0 in | Wt 269.4 lb

## 2018-12-05 DIAGNOSIS — E66813 Obesity, class 3: Secondary | ICD-10-CM

## 2018-12-05 DIAGNOSIS — Z6841 Body Mass Index (BMI) 40.0 and over, adult: Secondary | ICD-10-CM

## 2018-12-05 DIAGNOSIS — Z8759 Personal history of other complications of pregnancy, childbirth and the puerperium: Secondary | ICD-10-CM

## 2018-12-05 DIAGNOSIS — R8290 Unspecified abnormal findings in urine: Secondary | ICD-10-CM

## 2018-12-05 DIAGNOSIS — Z09 Encounter for follow-up examination after completed treatment for conditions other than malignant neoplasm: Secondary | ICD-10-CM | POA: Diagnosis not present

## 2018-12-05 DIAGNOSIS — R0602 Shortness of breath: Secondary | ICD-10-CM

## 2018-12-05 DIAGNOSIS — Z8709 Personal history of other diseases of the respiratory system: Secondary | ICD-10-CM

## 2018-12-05 DIAGNOSIS — Z3A12 12 weeks gestation of pregnancy: Secondary | ICD-10-CM

## 2018-12-05 DIAGNOSIS — F419 Anxiety disorder, unspecified: Secondary | ICD-10-CM

## 2018-12-05 LAB — POCT URINALYSIS DIPSTICK
Bilirubin, UA: NEGATIVE
Glucose, UA: NEGATIVE
Ketones, UA: NEGATIVE
Leukocytes, UA: NEGATIVE
Nitrite, UA: NEGATIVE
Protein, UA: POSITIVE — AB
Spec Grav, UA: >=1.03 — AB (ref 1.010–1.025)
Urobilinogen, UA: 0.2 E.U./dL
pH, UA: 7 (ref 5.0–8.0)

## 2018-12-05 MED ORDER — ALBUTEROL SULFATE HFA 108 (90 BASE) MCG/ACT IN AERS: 2.0000 | INHALATION_SPRAY | Freq: Four times a day (QID) | RESPIRATORY_TRACT | 11 refills | Status: DC | PRN

## 2018-12-05 NOTE — Progress Notes (Signed)
Patient [facility]   Established Patient Office Visit  Subjective:  Patient ID: Jennifer Ellison, female    DOB: Jan 25, 1986  Age: 33 y.o. MRN: [id number]  CC:  Chief Complaint  Patient presents with   Follow-up    er follow up   for camping , 8 weeks, having sob when laying down     HPI Jennifer Ellison is a 33 year old female who presents for Hospital Follow Up today.   Past Medical History:  Diagnosis Date   Anxiety    Hypoglycemia    Obesity    Current Status: Since her last office visit, she has had a ED visit on 11/24/2018 for threatened miscarriage. She has appointment with OB-GYN, 12/12/2018. She is approximately [redacted] weeks gestation at this time. Her anxiety is moderate today r/t unexpected pregnancy. She also has 56 and 50 year old sons.  Today she is doing well, but has increase fatigue and shortness of breath.  Her anxiety is mild today. She denies suicidal ideations, homicidal ideations, or auditory hallucinations.  She denies fevers, chills, fatigue, recent infections, weight loss, and night sweats. She has not had any headaches, visual changes, dizziness, and falls. No chest pain, heart palpitations, and cough reported. No reports of GI problems such as nausea, vomiting, diarrhea, and constipation. She has no reports of blood in stools, dysuria and hematuria. She denies pain today.   Past Surgical History:  Procedure Laterality Date   DILATION AND CURETTAGE OF UTERUS  2008   OVARY SURGERY  2012   left ovary removed; cyst related    Family History  Problem Relation Age of Onset   Asthma Brother    Diabetes Maternal Grandmother     Social History   Socioeconomic History   Marital status: Single    Spouse name: Not on file   Number of children: Not on file   Years of education: Not on file   Highest education level: Not on file  Occupational History   Not on file  Social Needs   Financial resource  strain: Not on file   Food insecurity    Worry: Not on file    Inability: Not on file   Transportation needs    Medical: Not on file    Non-medical: Not on file  Tobacco Use   Smoking status: Never Smoker   Smokeless tobacco: Never Used  Substance and Sexual Activity   Alcohol use: Yes   Drug use: No   Sexual activity: Not Currently    Birth control/protection: I.U.D.  Lifestyle   Physical activity    Days per week: Not on file    Minutes per session: Not on file   Stress: Not on file  Relationships   Social connections    Talks on phone: Not on file    Gets together: Not on file    Attends religious service: Not on file    Active member of club or organization: Not on file    Attends meetings of clubs or organizations: Not on file    Relationship status: Not on file   Intimate partner violence    Fear of current or ex partner: Not on file    Emotionally abused: Not on file    Physically abused: Not on file    Forced sexual activity: Not on file  Other Topics Concern   Not on file  Social History Narrative   Not on file    Outpatient  Medications Prior to Visit  Medication Sig Dispense Refill   cetirizine (ZYRTEC) 10 MG tablet Take 1 tablet (10 mg total) by mouth daily. (Patient not taking: Reported on 12/05/2018) 30 tablet 11   cyclobenzaprine (FLEXERIL) 10 MG tablet Take 1 tablet (10 mg total) by mouth 3 (three) times daily as needed for muscle spasms. (Patient not taking: Reported on 11/10/2017) 30 tablet 1   meloxicam (MOBIC) 15 MG tablet Take 1 tablet (15 mg total) by mouth daily. (Patient not taking: Reported on 08/17/2017) 30 tablet 0   sodium chloride (OCEAN) 0.65 % SOLN nasal spray Place 1 spray into both nostrils as needed for congestion. (Patient not taking: Reported on 03/10/2018) 1 Bottle 3   albuterol (PROVENTIL HFA;VENTOLIN HFA) 108 (90 Base) MCG/ACT inhaler Inhale 2 puffs into the lungs every 6 (six) hours as needed for wheezing or shortness  of breath. (Patient not taking: Reported on 12/05/2018) 1 Inhaler 0   Levonorgest-Eth Estrad-Fe Bisg (BALCOLTRA) 0.1-20 MG-MCG(21) TABS Take 1 tablet by mouth daily. (Patient not taking: Reported on 11/24/2018) 28 tablet 11   No facility-administered medications prior to visit.     Allergies  Allergen Reactions   Blueberry Flavor Swelling    ROS Review of Systems  Constitutional: Negative.   HENT: Negative.   Eyes: Negative.   Respiratory: Positive for shortness of breath (occasional ).   Cardiovascular: Negative.   Gastrointestinal: Negative.   Endocrine: Negative.   Genitourinary: Negative.   Musculoskeletal: Negative.   Skin: Negative.   Allergic/Immunologic: Negative.   Neurological: Negative.   Hematological: Negative.   Psychiatric/Behavioral: Negative.    Objective:    Physical Exam  Constitutional: She is oriented to person, place, and time. She appears well-developed and well-nourished.  HENT:  Head: Normocephalic and atraumatic.  Eyes: Conjunctivae are normal.  Neck: Normal range of motion. Neck supple.  Cardiovascular: Normal rate, regular rhythm, normal heart sounds and intact distal pulses.  Pulmonary/Chest: Effort normal and breath sounds normal.  Abdominal: Soft. Bowel sounds are normal. She exhibits distension (obese).  Musculoskeletal: Normal range of motion.  Neurological: She is alert and oriented to person, place, and time. She has normal reflexes.  Skin: Skin is warm and dry.  Psychiatric: She has a normal mood and affect. Her behavior is normal. Judgment and thought content normal.    BP 124/72 (BP Location: Right Arm, Patient Position: Sitting, Cuff Size: Large)    Pulse 94    Ht 5\' 2"  (1.575 m)    Wt 269 lb 6.4 oz (122.2 kg)    LMP 10/11/2018    SpO2 99%    BMI 49.27 kg/m  Wt Readings from Last 3 Encounters:  12/05/18 269 lb 6.4 oz (122.2 kg)  11/24/18 268 lb (121.6 kg)  03/28/18 248 lb (112.5 kg)     Health Maintenance Due  Topic Date Due    INFLUENZA VACCINE  10/21/2018    There are no preventive care reminders to display for this patient.  Lab Results  Component Value Date   TSH 1.480 08/17/2017   Lab Results  Component Value Date   WBC 10.5 11/24/2018   HGB 12.8 11/24/2018   HCT 41.1 11/24/2018   MCV 92.6 11/24/2018   PLT 324 11/24/2018   Lab Results  Component Value Date   NA 138 11/24/2018   K 3.4 (L) 11/24/2018   CO2 24 11/24/2018   GLUCOSE 103 (H) 11/24/2018   BUN 9 11/24/2018   CREATININE 0.61 11/24/2018   BILITOT 0.2 (L) 11/24/2018  ALKPHOS 70 11/24/2018   AST 36 11/24/2018   ALT 39 11/24/2018   PROT 7.5 11/24/2018   ALBUMIN 3.9 11/24/2018   CALCIUM 9.2 11/24/2018   ANIONGAP 9 11/24/2018   Lab Results  Component Value Date   CHOL 143 08/17/2017   Lab Results  Component Value Date   HDL 44 08/17/2017   Lab Results  Component Value Date   LDLCALC 88 08/17/2017   Lab Results  Component Value Date   TRIG 53 08/17/2017   Lab Results  Component Value Date   CHOLHDL 3.3 08/17/2017   Lab Results  Component Value Date   HGBA1C 5.4 08/17/2017   Assessment & Plan:   1. Hospital discharge follow-up  2. H/O threatened abortion  3. [redacted] weeks gestation of pregnancy She is tolerating pregnancy well.   4. Shortness of breath Stable today. No signs or symptoms of respiratory distress noted or reported.   5. History of sinusitis - albuterol (VENTOLIN HFA) 108 (90 Base) MCG/ACT inhaler; Inhale 2 puffs into the lungs every 6 (six) hours as needed for wheezing or shortness of breath.  Dispense: 8 g; Refill: 11  6. Class 3 severe obesity due to excess calories without serious comorbidity with body mass index (BMI) of 45.0 to 49.9 in adult [facility](HCC) Body mass index is 49.27 kg/m. Goal BMI  is <30. Encouraged efforts to reduce weight include engaging in physical activity as tolerated with goal of 150 minutes per week. Improve dietary choices and eat a meal regimen consistent with a Mediterranean  or DASH diet. Reduce simple carbohydrates. Do not skip meals and eat healthy snacks throughout the day to avoid over-eating at dinner. Set a goal weight loss that is achievable for you.  7. Anxiety Stable today.   8. Abnormal urine - POCT Urinalysis Dipstick  9. Follow up She will follow up in 3 months.   Meds ordered this encounter  Medications   albuterol (VENTOLIN HFA) 108 (90 Base) MCG/ACT inhaler    Sig: Inhale 2 puffs into the lungs every 6 (six) hours as needed for wheezing or shortness of breath.    Dispense:  8 g    Refill:  11    Orders Placed This Encounter  Procedures   POCT Urinalysis Dipstick    Referral Orders  No referral(s) requested today    Raliegh IpNatalie Hallie Ishida,  MSN, FNP-BC [facility] [address]  [place], [place] [address] [phone]   Problem List Items Addressed This Visit    None    Visit Diagnoses    Hospital discharge follow-up    -  Primary   H/O threatened abortion       [redacted] weeks gestation of pregnancy       Shortness of breath       Acute sinusitis, recurrence not specified, unspecified location       Relevant Medications   albuterol (VENTOLIN HFA) 108 (90 Base) MCG/ACT inhaler   Class 3 severe obesity due to excess calories without serious comorbidity with body mass index (BMI) of 45.0 to 49.9 in adult [facility](HCC)       Anxiety       Follow up          Meds ordered this encounter  Medications   albuterol (VENTOLIN HFA) 108 (90 Base) MCG/ACT inhaler    Sig: Inhale 2 puffs into the lungs every 6 (six) hours as needed for wheezing or shortness of breath.  Dispense:  8 g    Refill:  11    Follow-up: Return in about 3 months (around 03/06/2019).    Kallie LocksNatalie M Elyzabeth Goatley, FNP

## 2018-12-06 ENCOUNTER — Ambulatory Visit: Payer: Medicaid Other | Admitting: Family Medicine

## 2018-12-07 ENCOUNTER — Encounter: Payer: Self-pay | Admitting: Family Medicine

## 2018-12-07 DIAGNOSIS — E66813 Obesity, class 3: Secondary | ICD-10-CM | POA: Insufficient documentation

## 2018-12-07 DIAGNOSIS — R0602 Shortness of breath: Secondary | ICD-10-CM | POA: Insufficient documentation

## 2018-12-07 DIAGNOSIS — Z6841 Body Mass Index (BMI) 40.0 and over, adult: Secondary | ICD-10-CM | POA: Insufficient documentation

## 2018-12-07 DIAGNOSIS — F419 Anxiety disorder, unspecified: Secondary | ICD-10-CM | POA: Insufficient documentation

## 2018-12-12 ENCOUNTER — Other Ambulatory Visit: Payer: Self-pay | Admitting: Family Medicine

## 2018-12-12 DIAGNOSIS — J019 Acute sinusitis, unspecified: Secondary | ICD-10-CM

## 2018-12-12 DIAGNOSIS — J302 Other seasonal allergic rhinitis: Secondary | ICD-10-CM

## 2019-01-24 ENCOUNTER — Other Ambulatory Visit: Payer: Self-pay

## 2019-01-24 ENCOUNTER — Ambulatory Visit (INDEPENDENT_AMBULATORY_CARE_PROVIDER_SITE_OTHER): Payer: Medicaid Other | Admitting: Obstetrics and Gynecology

## 2019-01-24 ENCOUNTER — Encounter: Payer: Self-pay | Admitting: Obstetrics and Gynecology

## 2019-01-24 VITALS — BP 115/80 | HR 105 | Wt 276.7 lb

## 2019-01-24 DIAGNOSIS — O99212 Obesity complicating pregnancy, second trimester: Secondary | ICD-10-CM | POA: Diagnosis not present

## 2019-01-24 DIAGNOSIS — O0990 Supervision of high risk pregnancy, unspecified, unspecified trimester: Secondary | ICD-10-CM | POA: Insufficient documentation

## 2019-01-24 DIAGNOSIS — Z6841 Body Mass Index (BMI) 40.0 and over, adult: Secondary | ICD-10-CM

## 2019-01-24 DIAGNOSIS — Z3A15 15 weeks gestation of pregnancy: Secondary | ICD-10-CM

## 2019-01-24 DIAGNOSIS — R8271 Bacteriuria: Secondary | ICD-10-CM | POA: Insufficient documentation

## 2019-01-24 DIAGNOSIS — Z3480 Encounter for supervision of other normal pregnancy, unspecified trimester: Secondary | ICD-10-CM

## 2019-01-24 MED ORDER — PREPLUS 27-1 MG PO TABS: 1.0000 | ORAL_TABLET | Freq: Every day | ORAL | 13 refills | Status: DC

## 2019-01-24 MED ORDER — BLOOD PRESSURE CUFF MISC: 1.0000 | 0 refills | Status: DC

## 2019-01-24 NOTE — Addendum Note (Signed)
Addended by: Vivien Rota on: 01/24/2019 12:00 PM   Modules accepted: Orders

## 2019-01-24 NOTE — Progress Notes (Signed)
INITIAL PRENATAL VISIT NOTE  Subjective:  Jennifer Ellison is a 33 y.o. G4W1027 at [redacted]w[redacted]d by LMP being seen today for her initial prenatal visit. She is transferring care from [facility]. This is an unplanned pregnancy. She and partner are happy with the pregnancy. She was using OCPs for birth control previously. She has an obstetric history significant for 2 x FT deliveries, D&C for retained placenta after one. She has a medical history significant for n/a. S/p Left oophorectomy.  Patient reports fatigue.  Contractions: Irritability. Vag. Bleeding: None.   . Denies leaking of fluid.    Past Medical History:  Diagnosis Date  . Anxiety   . History of sinusitis   . Hypoglycemia   . Obese   . Obesity   . Pregnancy 09/2018  . Shortness of breath     Past Surgical History:  Procedure Laterality Date  . DILATION AND CURETTAGE OF UTERUS  2008  . OVARY SURGERY  2012   left ovary removed; cyst related    OB History  Gravida Para Term Preterm AB Living  5 2 2   2 2   SAB TAB Ectopic Multiple Live Births  2       2    # Outcome Date GA Lbr Len/2nd Weight Sex Delivery Anes PTL Lv  5 Current           4 Term 06/20/06    Jerilynn Mages Vag-Spont   LIV  3 SAB 2008          2 Term 04/20/02    M Vag-Spont   LIV  1 SAB 2003            Social History   Socioeconomic History  . Marital status: Single    Spouse name: Not on file  . Number of children: Not on file  . Years of education: Not on file  . Highest education level: Not on file  Occupational History  . Not on file  Social Needs  . Financial resource strain: Not on file  . Food insecurity    Worry: Not on file    Inability: Not on file  . Transportation needs    Medical: Not on file    Non-medical: Not on file  Tobacco Use  . Smoking status: Never Smoker  . Smokeless tobacco: Never Used  Substance and Sexual Activity  . Alcohol use: Not Currently  . Drug use: No  . Sexual activity: Not Currently  Lifestyle  .  Physical activity    Days per week: Not on file    Minutes per session: Not on file  . Stress: Not on file  Relationships  . Social Herbalist on phone: Not on file    Gets together: Not on file    Attends religious service: Not on file    Active member of club or organization: Not on file    Attends meetings of clubs or organizations: Not on file    Relationship status: Not on file  Other Topics Concern  . Not on file  Social History Narrative  . Not on file    Family History  Problem Relation Age of Onset  . Asthma Brother   . Diabetes Maternal Grandmother      Current Outpatient Medications:  .  cetirizine (ZYRTEC) 10 MG tablet, TAKE ONE TABLET BY MOUTH DAILY, Disp: 30 tablet, Rfl: 10 .  Prenatal Vit-Fe Fumarate-FA (PREPLUS) 27-1 MG TABS, Take 1 tablet by mouth daily., Disp:  30 tablet, Rfl: 13 .  sodium chloride (OCEAN) 0.65 % SOLN nasal spray, Place 1 spray into both nostrils as needed for congestion. (Patient not taking: Reported on 03/10/2018), Disp: 1 Bottle, Rfl: 3  Allergies  Allergen Reactions  . Blueberry Flavor Swelling    Review of Systems: Negative except for what is mentioned in HPI.  Objective:   Vitals:   01/24/19 1116  BP: 115/80  Pulse: (!) 105  Weight: 276 lb 11.2 oz (125.5 kg)    Fetal Status: Fetal Heart Rate (bpm): 150         Physical Exam: BP 115/80   Pulse (!) 105   Wt 276 lb 11.2 oz (125.5 kg)   LMP 10/11/2018   BMI 50.61 kg/m  CONSTITUTIONAL: Well-developed, well-nourished female in no acute distress.  NEUROLOGIC: Alert and oriented to person, place, and time. Normal reflexes, muscle tone coordination. No cranial nerve deficit noted. PSYCHIATRIC: Normal mood and affect. Normal behavior. Normal judgment and thought content. SKIN: Skin is warm and dry. No rash noted. Not diaphoretic. No erythema. No pallor. HENT:  Normocephalic, atraumatic, External right and left ear normal. Oropharynx is clear and moist EYES:  Conjunctivae and EOM are normal. Pupils are equal, round, and reactive to light. No scleral icterus.  NECK: Normal range of motion, supple, no masses CARDIOVASCULAR: Normal heart rate noted, regular rhythm RESPIRATORY: Effort and breath sounds normal, no problems with respiration noted BREASTS: deferred ABDOMEN: Soft, nontender, nondistended, gravid. GU: deferred MUSCULOSKELETAL: Normal range of motion. EXT:  No edema and no tenderness. 2+ distal pulses.   Assessment and Plan:  Pregnancy: [id number] at [redacted]w[redacted]d by LMP  1. Supervision of other normal pregnancy, antepartum Reviewed [facility] [employer] structure, multiple providers, fellows, medical students, virtual visits, MyChart.  - Type and screen; Future  2. Obesity affecting pregnancy in second trimester - [place] MFM OB DETAIL +14 WK; Future - Hemoglobin A1c   Preterm labor symptoms and general obstetric precautions including but not limited to vaginal bleeding, contractions, leaking of fluid and fetal movement were reviewed in detail with the patient.  Please refer to After Visit Summary for other counseling recommendations.   Return in about 4 weeks (around 02/21/2019) for low OB, virtual.  Conan Bowens 01/24/2019 11:59 AM

## 2019-01-24 NOTE — Progress Notes (Signed)
Pt is here for ROB. She is a transfer. [redacted]w[redacted]d.

## 2019-01-24 NOTE — Patient Instructions (Signed)
HOW TO TAKE YOUR BLOOD PRESSURE AT HOME ° °Take your blood pressure at home. Pick one day a week and take your blood pressure consistently every week on this day. Relax quietly for about 15 minutes then take your blood pressure. DO NOT take it when you are upset, stressed, or have just been active. Make sure your blood pressure cuff fits appropriately, there should be measurements on the box and on the cuff to help guide you.  ° °Once you have taken your blood pressure, look at the numbers. If the top number (systolic) is equal to or above 140, please call the office and let us know. If the bottom number (diastolic) is equal to or above 90, please call the office and let us know.  ° °If you have any questions or are concerned you are not taking your blood pressure correctly, please call the office! ° °If you have a headache that does not improve, problems with your vision, abdominal pain or other concerns, please call and let us know. Please go to the hospital with any severe symptoms. Please go to the hospital for labor, painful contractions every 5 minutes or less, leaking of fluid, or if you have not felt normal fetal movement.  ° °

## 2019-01-24 NOTE — Addendum Note (Signed)
Addended by: Cleotilde Neer on: 01/24/2019 12:02 PM   Modules accepted: Orders

## 2019-01-25 ENCOUNTER — Telehealth: Payer: Self-pay

## 2019-01-25 LAB — HEMOGLOBIN A1C
Est. average glucose Bld gHb Est-mCnc: 117 mg/dL
Hgb A1c MFr Bld: 5.7 % — ABNORMAL HIGH (ref 4.8–5.6)

## 2019-01-25 LAB — ANTIBODY SCREEN: Antibody Screen: NEGATIVE

## 2019-01-25 NOTE — Telephone Encounter (Signed)
-----   Message from Jennifer Leiter, MD sent at 01/25/2019  8:57 AM EST ----- Needs to do early 2 hr GTT, please call and set up her lab appt

## 2019-01-25 NOTE — Telephone Encounter (Signed)
  Patient notified of results.  Fasting Lab visit scheduled for 01/26/19 @ 8:30am.  She verbalized understanding.

## 2019-01-26 ENCOUNTER — Other Ambulatory Visit: Payer: Self-pay

## 2019-01-26 ENCOUNTER — Other Ambulatory Visit: Payer: Medicaid Other

## 2019-01-26 DIAGNOSIS — Z3480 Encounter for supervision of other normal pregnancy, unspecified trimester: Secondary | ICD-10-CM

## 2019-01-30 LAB — AFP, SERUM, OPEN SPINA BIFIDA
AFP MoM: 1.02
AFP Value: 23.8 ng/mL
Gest. Age on Collection Date: 15.3 weeks
Maternal Age At EDD: 33.7 yr
OSBR Risk 1 IN: 10000
Test Results:: NEGATIVE
Weight: 276 [lb_av]

## 2019-01-30 LAB — GLUCOSE TOLERANCE, 2 HOURS W/ 1HR
Glucose, 1 hour: 120 mg/dL (ref 65–179)
Glucose, 2 hour: 92 mg/dL (ref 65–152)
Glucose, Fasting: 85 mg/dL (ref 65–91)

## 2019-02-21 ENCOUNTER — Telehealth (INDEPENDENT_AMBULATORY_CARE_PROVIDER_SITE_OTHER): Payer: Medicaid Other | Admitting: Obstetrics

## 2019-02-21 ENCOUNTER — Encounter: Payer: Self-pay | Admitting: Obstetrics

## 2019-02-21 DIAGNOSIS — Z3A19 19 weeks gestation of pregnancy: Secondary | ICD-10-CM

## 2019-02-21 DIAGNOSIS — Z3482 Encounter for supervision of other normal pregnancy, second trimester: Secondary | ICD-10-CM

## 2019-02-21 DIAGNOSIS — M549 Dorsalgia, unspecified: Secondary | ICD-10-CM

## 2019-02-21 DIAGNOSIS — Z3480 Encounter for supervision of other normal pregnancy, unspecified trimester: Secondary | ICD-10-CM

## 2019-02-21 DIAGNOSIS — R8271 Bacteriuria: Secondary | ICD-10-CM

## 2019-02-21 MED ORDER — COMFORT FIT MATERNITY SUPP SM MISC: 0 refills | Status: DC

## 2019-02-21 NOTE — Progress Notes (Signed)
   TELEHEALTH VIRTUAL OBSTETRICS VISIT ENCOUNTER NOTE  I connected with Jennifer Ellison on 02/21/19 at  2:00 PM EST by telephone at home and verified that I am speaking with the correct person using two identifiers.   I discussed the limitations, risks, security and privacy concerns of performing an evaluation and management service by telephone and the availability of in person appointments. I also discussed with the patient that there may be a patient responsible charge related to this service. The patient expressed understanding and agreed to proceed.  Subjective:  Jennifer Ellison is a 33 y.o. [id number] at [redacted]w[redacted]d being followed for ongoing prenatal care.  She is currently monitored for the following issues for this low-risk pregnancy and has Shortness of breath; Class 3 severe obesity due to excess calories without serious comorbidity with body mass index (BMI) of 45.0 to 49.9 in adult [facility]); Anxiety; Supervision of other normal pregnancy, antepartum; and Group B streptococcal bacteriuria on their problem list.  Patient reports backache. Reports fetal movement. Denies any contractions, bleeding or leaking of fluid.   The following portions of the patient's history were reviewed and updated as appropriate: allergies, current medications, past family history, past medical history, past social history, past surgical history and problem list.   Objective:   General:  Alert, oriented and cooperative.   Mental Status: Normal mood and affect perceived. Normal judgment and thought content.  Rest of physical exam deferred due to type of encounter  Assessment and Plan:  Pregnancy: [id number] at [redacted]w[redacted]d 1. Supervision of other normal pregnancy, antepartum  2. Group B streptococcal bacteriuria - treat in labor  3. Backache symptom Rx: - Elastic Bandages & Supports (COMFORT FIT MATERNITY SUPP SM) MISC; Wear as directed.  Dispense: 1 each; Refill: 0   Preterm labor symptoms and general obstetric  precautions including but not limited to vaginal bleeding, contractions, leaking of fluid and fetal movement were reviewed in detail with the patient.  I discussed the assessment and treatment plan with the patient. The patient was provided an opportunity to ask questions and all were answered. The patient agreed with the plan and demonstrated an understanding of the instructions. The patient was advised to call back or seek an in-person office evaluation/go to MAU at [facility] for any urgent or concerning symptoms. Please refer to After Visit Summary for other counseling recommendations.   I provided 10 minutes of non-face-to-face time during this encounter.  Return in about 4 weeks (around 03/21/2019) for ROB.  Future Appointments  Date Time Provider [place]  02/23/2019  1:00 PM [place] [facility]-US  02/23/2019  1:00 PM [place] 3 WH-MFCUS MFC-US  03/06/2019  1:20 PM Azzie Glatter, FNP [facility] None    Baltazar Najjar, [place] for [facility], [place] [facility] 02/21/2019

## 2019-02-21 NOTE — Progress Notes (Signed)
Virtual ROB   CC: round  ligament pain   B/P:123/72  P: 97

## 2019-02-23 ENCOUNTER — Ambulatory Visit (HOSPITAL_COMMUNITY): Payer: Medicaid Other | Admitting: *Deleted

## 2019-02-23 ENCOUNTER — Other Ambulatory Visit (HOSPITAL_COMMUNITY): Payer: Self-pay | Admitting: *Deleted

## 2019-02-23 ENCOUNTER — Ambulatory Visit (HOSPITAL_COMMUNITY)
Admission: RE | Admit: 2019-02-23 | Discharge: 2019-02-23 | Disposition: A | Discharge status: Home / Self Care | Payer: Medicaid Other | Source: Ambulatory Visit | Attending: Obstetrics and Gynecology | Admitting: Obstetrics and Gynecology

## 2019-02-23 ENCOUNTER — Other Ambulatory Visit: Payer: Self-pay

## 2019-02-23 ENCOUNTER — Encounter (HOSPITAL_COMMUNITY): Payer: Self-pay

## 2019-02-23 DIAGNOSIS — Z3480 Encounter for supervision of other normal pregnancy, unspecified trimester: Secondary | ICD-10-CM | POA: Insufficient documentation

## 2019-02-23 DIAGNOSIS — Z3A19 19 weeks gestation of pregnancy: Secondary | ICD-10-CM | POA: Diagnosis not present

## 2019-02-23 DIAGNOSIS — R8271 Bacteriuria: Secondary | ICD-10-CM

## 2019-02-23 DIAGNOSIS — O99212 Obesity complicating pregnancy, second trimester: Secondary | ICD-10-CM | POA: Diagnosis not present

## 2019-02-23 DIAGNOSIS — Z362 Encounter for other antenatal screening follow-up: Secondary | ICD-10-CM

## 2019-03-06 ENCOUNTER — Ambulatory Visit: Payer: Medicaid Other | Admitting: Family Medicine

## 2019-03-21 ENCOUNTER — Encounter: Payer: Self-pay | Admitting: Medical

## 2019-03-21 ENCOUNTER — Telehealth (INDEPENDENT_AMBULATORY_CARE_PROVIDER_SITE_OTHER): Payer: Medicaid Other | Admitting: Medical

## 2019-03-21 VITALS — BP 123/72 | HR 97

## 2019-03-21 DIAGNOSIS — Z3A23 23 weeks gestation of pregnancy: Secondary | ICD-10-CM | POA: Diagnosis not present

## 2019-03-21 DIAGNOSIS — R8271 Bacteriuria: Secondary | ICD-10-CM

## 2019-03-21 DIAGNOSIS — O99212 Obesity complicating pregnancy, second trimester: Secondary | ICD-10-CM | POA: Diagnosis not present

## 2019-03-21 DIAGNOSIS — Z3480 Encounter for supervision of other normal pregnancy, unspecified trimester: Secondary | ICD-10-CM

## 2019-03-21 NOTE — Progress Notes (Signed)
I connected with Jennifer Ellison on 03/21/19 at 10:35 AM EST by: MyChart and verified that I am speaking with the correct person using two identifiers.  Patient is located at home and provider is located at [facility].     The purpose of this virtual visit is to provide medical care while limiting exposure to the novel coronavirus. I discussed the limitations, risks, security and privacy concerns of performing an evaluation and management service by MyChart and the availability of in person appointments. I also discussed with the patient that there may be a patient responsible charge related to this service. By engaging in this virtual visit, you consent to the provision of healthcare.  Additionally, you authorize for your insurance to be billed for the services provided during this visit.  The patient expressed understanding and agreed to proceed.  The following staff members participated in the virtual visit:  Jasmine Awe, [place]    PRENATAL VISIT NOTE  Subjective:  Jennifer Ellison is a 33 y.o. K4M0102 at [redacted]w[redacted]d  for phone visit for ongoing prenatal care.  She is currently monitored for the following issues for this low-risk pregnancy and has Shortness of breath; Class 3 severe obesity due to excess calories without serious comorbidity with body mass index (BMI) of 45.0 to 49.9 in adult [facility]); Anxiety; Supervision of other normal pregnancy, antepartum; and Group B streptococcal bacteriuria on their problem list.  Patient reports occasional contractions.  Contractions: Irritability. Vag. Bleeding: None.  Movement: Present. Denies leaking of fluid.   The following portions of the patient's history were reviewed and updated as appropriate: allergies, current medications, past family history, past medical history, past social history, past surgical history and problem list.   Objective:   Vitals:   03/21/19 1038  BP: 123/72  Pulse: 97   Self-Obtained  Fetal Status:     Movement: Present       Assessment and Plan:  Pregnancy: [id number] at [redacted]w[redacted]d 1. Supervision of other normal pregnancy, antepartum - Doing well, only a couple of mild BH contractions - Had a fall at home almost a month ago, fell on knees, no pain or bleeding  - Follow-up US to complete anatomy scheduled 03/26/19 - Discussed need for GTT at next visit. Patient had issues with feeling faint and nauseous with early 2 hour GTT. Advised to would find somewhere for her to lay down during her visit   2. Group B streptococcal bacteriuria - Treat in labor  3. Class 3 severe obesity due to excess calories without serious comorbidity with body mass index (BMI) of 45.0 to 49.9 in adult [facility])  Preterm labor symptoms and general obstetric precautions including but not limited to vaginal bleeding, contractions, leaking of fluid and fetal movement were reviewed in detail with the patient.  Return in about 4 weeks (around 04/18/2019) for LOB, In-Person, 28 week labs (fasting).  Future Appointments  Date Time Provider [place]  03/26/2019 11:15 AM [place] 4 WH-MFCUS MFC-US  03/26/2019 11:20 AM WH-MFC NURSE [place] MFC-US     Time spent on virtual visit: 15 minutes  Kerry Hough, PA-C

## 2019-03-21 NOTE — Patient Instructions (Signed)

## 2019-03-21 NOTE — Progress Notes (Signed)
I connected with  Jennifer Ellison on 03/21/19 by a video enabled telemedicine application and verified that I am speaking with the correct person using two identifiers.   I discussed the limitations of evaluation and management by telemedicine. The patient expressed understanding and agreed to proceed.  [place] ROB.  C/o pressure, she fell on her knees 3 weeks ago, she didn't get hurt, denies pain, LOF.

## 2019-03-23 NOTE — L&D Delivery Note (Addendum)
OB/GYN Faculty Practice Delivery Note  Jennifer Ellison is a 34 y.o. G5X6468 s/p VD at [redacted]w[redacted]d. She was admitted for IOL for GDMA1.   ROM: 7h 83m with clear fluid GBS Status:  Positive/-- (04/06 0417) Maximum Maternal Temperature: 97.7 F   Labor Progress: . Patient presented to L&D for IOL. Initial SVE: 1/50/-2. Labor was induced with Cytotec, foley bulb and pitocin. She then progressed to complete.   Delivery Date/Time: 07/11/19 at 2337  Delivery: Called to room and patient was complete and pushing. Head delivered LOA. No nuchal cord present. Shoulder and body delivered in usual fashion. Infant with spontaneous cry, placed on mother's abdomen, dried and stimulated. Cord clamped x 2 after 1-minute delay, and cut by female family member. Cord blood drawn. Placenta delivered spontaneously with gentle cord traction. Fundus firm with massage and Pitocin. Labia, perineum, vagina, and cervix inspected inspected with a hemostatic periclitoral laceration on the left, not repaired. Patient had some PP bleeding with sweep of LUS producing clots, for which methergine was given with good effect.   After delivery of the placenta a post-placental Liletta IUD was placed, see separate procedure note.   Baby Weight: 3070g  Placenta: Sent to L&D Complications: none Lacerations: left periclitoral, hemostatic not repaired EBL: Analgesia: Epidural   Infant: APGAR (1 MIN): 8   APGAR (5 MINS): 9    [name], DO PGY-1, [facility] 07/12/2019 1:36 AM     OB FELLOW ATTESTATION  I was present, gloved, and supervising throughout delivery and have edited the above note to reflect any changes or updates.  Zack Seal, MD/[facility] OB Fellow  07/12/2019, 2:22 AM

## 2019-03-26 ENCOUNTER — Encounter (HOSPITAL_COMMUNITY): Payer: Self-pay | Admitting: *Deleted

## 2019-03-26 ENCOUNTER — Other Ambulatory Visit: Payer: Self-pay

## 2019-03-26 ENCOUNTER — Ambulatory Visit (HOSPITAL_COMMUNITY): Payer: Medicaid Other | Admitting: *Deleted

## 2019-03-26 ENCOUNTER — Ambulatory Visit (HOSPITAL_COMMUNITY)
Admission: RE | Admit: 2019-03-26 | Discharge: 2019-03-26 | Disposition: A | Discharge status: Home / Self Care | Payer: Medicaid Other | Source: Ambulatory Visit | Attending: Obstetrics and Gynecology | Admitting: Obstetrics and Gynecology

## 2019-03-26 ENCOUNTER — Other Ambulatory Visit (HOSPITAL_COMMUNITY): Payer: Self-pay | Admitting: *Deleted

## 2019-03-26 DIAGNOSIS — Z3480 Encounter for supervision of other normal pregnancy, unspecified trimester: Secondary | ICD-10-CM | POA: Insufficient documentation

## 2019-03-26 DIAGNOSIS — Z3A23 23 weeks gestation of pregnancy: Secondary | ICD-10-CM | POA: Diagnosis not present

## 2019-03-26 DIAGNOSIS — R8271 Bacteriuria: Secondary | ICD-10-CM | POA: Insufficient documentation

## 2019-03-26 DIAGNOSIS — O99212 Obesity complicating pregnancy, second trimester: Secondary | ICD-10-CM

## 2019-03-26 DIAGNOSIS — Z362 Encounter for other antenatal screening follow-up: Secondary | ICD-10-CM

## 2019-03-26 DIAGNOSIS — O99210 Obesity complicating pregnancy, unspecified trimester: Secondary | ICD-10-CM

## 2019-04-16 ENCOUNTER — Telehealth: Payer: Self-pay

## 2019-04-16 NOTE — Telephone Encounter (Signed)
Return call to patient regarding concerns Pt noticed mucous like discharge.  Pt denies any bleeding, no odor or itching no recent intercourse .  Pt has upcoming appt pt advised she can request cervical swab/evaluation at upcoming appt for assurance. Pt voiced understanding.

## 2019-04-18 ENCOUNTER — Other Ambulatory Visit: Payer: Medicaid Other

## 2019-04-19 ENCOUNTER — Other Ambulatory Visit: Payer: Medicaid Other

## 2019-04-19 ENCOUNTER — Other Ambulatory Visit: Payer: Self-pay

## 2019-04-19 ENCOUNTER — Ambulatory Visit (INDEPENDENT_AMBULATORY_CARE_PROVIDER_SITE_OTHER): Payer: Medicaid Other

## 2019-04-19 VITALS — BP 137/82 | HR 106 | Wt 292.8 lb

## 2019-04-19 DIAGNOSIS — Z3A27 27 weeks gestation of pregnancy: Secondary | ICD-10-CM

## 2019-04-19 DIAGNOSIS — Z3482 Encounter for supervision of other normal pregnancy, second trimester: Secondary | ICD-10-CM

## 2019-04-19 DIAGNOSIS — Z3480 Encounter for supervision of other normal pregnancy, unspecified trimester: Secondary | ICD-10-CM

## 2019-04-19 DIAGNOSIS — R8271 Bacteriuria: Secondary | ICD-10-CM

## 2019-04-19 NOTE — Patient Instructions (Signed)
Group B Streptococcus Infection During Pregnancy °Group B Streptococcus (GBS) is a type of bacteria that is often found in healthy people. It is commonly found in the rectum, vagina, and intestines. In people who are healthy and not pregnant, the bacteria rarely cause serious illness or complications. However, women who test positive for GBS during pregnancy can pass the bacteria to the baby during childbirth. This can cause serious infection in the baby after birth. °Women with GBS may also have infections during their pregnancy or soon after childbirth. The infections include urinary tract infections (UTIs) or infections of the uterus. GBS also increases a woman's risk of complications during pregnancy, such as early labor or delivery, miscarriage, or stillbirth. Routine testing for GBS is recommended for all pregnant women. °What are the causes? °This condition is caused by bacteria called Streptococcus agalactiae. °What increases the risk? °You may have a higher risk for GBS infection during pregnancy if you had one during a past pregnancy. °What are the signs or symptoms? °In most cases, GBS infection does not cause symptoms in pregnant women. If symptoms exist, they may include: °· Labor that starts before the 37th week of pregnancy. °· A UTI or bladder infection. This may cause a fever, frequent urination, or pain and burning during urination. °· Fever during labor. There can also be a rapid heartbeat in the mother or baby. °Rare but serious symptoms of a GBS infection in women include: °· Blood infection (septicemia). This may cause fever, chills, or confusion. °· Lung infection (pneumonia). This may cause fever, chills, cough, rapid breathing, chest pain, or difficulty breathing. °· Bone, joint, skin, or soft tissue infection. °How is this diagnosed? °You may be screened for GBS between week 35 and week 37 of pregnancy. If you have symptoms of preterm labor, you may be screened earlier. This condition is  diagnosed based on lab test results from: °· A swab of fluid from the vagina and rectum. °· A urine sample. °How is this treated? °This condition is treated with antibiotic medicine. Antibiotic medicine may be given: °· To you when you go into labor, or as soon as your water breaks. The medicines will continue until after you give birth. If you are having a cesarean delivery, you do not need antibiotics unless your water has broken. °· To your baby, if he or she requires treatment. Your health care provider will check your baby to decide if he or she needs antibiotics to prevent a serious infection. °Follow these instructions at home: °· Take over-the-counter and prescription medicines only as told by your health care provider. °· Take your antibiotic medicine as told by your health care provider. Do not stop taking the antibiotic even if you start to feel better. °· Keep all pre-birth (prenatal) visits and follow-up visits as told by your health care provider. This is important. °Contact a health care provider if: °· You have pain or burning when you urinate. °· You have to urinate more often than usual. °· You have a fever or chills. °· You develop a bad-smelling vaginal discharge. °Get help right away if: °· Your water breaks. °· You go into labor. °· You have severe pain in your abdomen. °· You have difficulty breathing. °· You have chest pain. °These symptoms may represent a serious problem that is an emergency. Do not wait to see if the symptoms will go away. Get medical help right away. Call your local emergency services (911 in the [place] Do not drive yourself to   the hospital. °Summary °· GBS is a type of bacteria that is common in healthy people. °· During pregnancy, colonization with GBS can cause serious complications for you or your baby. °· Your health care provider will screen you between 35 and 37 weeks of pregnancy to determine if you are colonized with GBS. °· If you are colonized with GBS during  pregnancy, your health care provider will recommend antibiotics through an IV during labor. °· After delivery, your baby will be evaluated for complications related to potential GBS infection and may require antibiotics to prevent a serious infection. °This information is not intended to replace advice given to you by your health care provider. Make sure you discuss any questions you have with your health care provider. °Document Revised: 10/02/2018 Document Reviewed: 10/02/2018 °Elsevier Patient Education © 2020 [employer] ° °

## 2019-04-19 NOTE — Progress Notes (Signed)
Pt is here for ROB. [redacted]w[redacted]d.

## 2019-04-19 NOTE — Progress Notes (Signed)
PRENATAL VISIT NOTE  Subjective:  Jennifer Ellison is a 34 y.o. [id number] at [redacted]w[redacted]d who presents today for routine prenatal care.  She is currently being monitored for supervision of a high-risk pregnancy with problems as listed below.  Patient has no pregnancy related concerns and endorses fetal movement, while denying contractions.  She also reports one incident of vaginal discharge that was "very light light green."  However, patient denies other incidents as well as other symptoms including bleeding, leaking, itching, and burning. Patient reports desire to meet as many providers as possible prior to delivery.    Patient Active Problem List   Diagnosis Date Noted  . Supervision of other normal pregnancy, antepartum 01/24/2019  . Group B streptococcal bacteriuria 01/24/2019  . Shortness of breath 12/07/2018  . Class 3 severe obesity due to excess calories without serious comorbidity with body mass index (BMI) of 45.0 to 49.9 in adult ([facility]) 12/07/2018  . Anxiety 12/07/2018    The following portions of the patient's history were reviewed and updated as appropriate: allergies, current medications, past family history, past medical history, past social history, past surgical history and problem list. Problem list updated.  Objective:   Vitals:   04/19/19 0852  BP: 137/82  Pulse: (!) 106  Weight: 292 lb 12.8 oz (132.8 kg)    Fetal Status: Fetal Heart Rate (bpm): 150 Fundal Height: 32 cm Movement: Present     General:  Alert, oriented and cooperative. Patient is in no acute distress.  Skin: Skin is warm and dry.   Cardiovascular: Regular rate and rhythm.  Respiratory: Normal respiratory effort. CTA-Bilaterally  Abdomen: Soft, gravid, appropriate for gestational age.  Pelvic: Cervical exam deferred        Extremities: Normal range of motion.  Edema: None  Mental Status: Normal mood and affect. Normal behavior. Normal judgment and thought content.   Assessment and Plan:  Pregnancy:  [id number] at [redacted]w[redacted]d  1. Supervision of other normal pregnancy, antepartum -Reviewed [facility] practice and model of care including teaching of medical students, residents, and midwife students.  Also discussed how fellows are part of team and participate in all aspects of patient care. -Encouraged to try to meet as many [facility] providers as possible.  Discussed that she can requests a provider from said office to participate in her delivery if they are in house at that time. -Reviewed labs today and provider process with releasing of results to mychart. -Informed that if abnormal additional education would be necessary. -Anticipatory guidance for upcoming appts.  - Glucose Tolerance, 2 Hours w/1 Hour - CBC - RPR - HIV Antibody (routine testing w rflx)  2. Group B streptococcal bacteriuria -Reviewed GBS positive status. -Informed that treatment would be necessary at time of delivery. -Discussed GBS in depth including what it is, why we treat, and risks factors for infant with lack of and/or improper treatment.   3. Class 3 severe obesity due to excess calories without serious comorbidity with body mass index (BMI) of 45.0 to 49.9 in adult [facility]) -FH today 32 -Growth [place] Appt scheduled for 2/15.   Preterm labor symptoms and general obstetric precautions including but not limited to vaginal bleeding, contractions, leaking of fluid and fetal movement were reviewed with the patient.  Please refer to After Visit Summary for other counseling recommendations.  Return in about 3 weeks (around 05/10/2019) for HR-ROB via Virtual Visit.  Future Appointments  Date Time Provider [place]  05/07/2019 12:40 PM [place] [facility]  05/07/2019 12:45 PM  WH-MFC [place] 2 WH-MFCUS MFC-US    Cherre Robins, CNM 04/19/2019, 9:13 AM

## 2019-04-20 LAB — CBC
Hematocrit: 41 % (ref 34.0–46.6)
Hemoglobin: 13.6 g/dL (ref 11.1–15.9)
MCH: 29 pg (ref 26.6–33.0)
MCHC: 33.2 g/dL (ref 31.5–35.7)
MCV: 87 fL (ref 79–97)
Platelets: 337 10*3/uL (ref 150–450)
RBC: 4.69 x10E6/uL (ref 3.77–5.28)
RDW: 14.2 % (ref 11.7–15.4)
WBC: 15.3 10*3/uL — ABNORMAL HIGH (ref 3.4–10.8)

## 2019-04-20 LAB — GLUCOSE TOLERANCE, 2 HOURS W/ 1HR
Glucose, 1 hour: 169 mg/dL (ref 65–179)
Glucose, 2 hour: 134 mg/dL (ref 65–152)
Glucose, Fasting: 94 mg/dL — ABNORMAL HIGH (ref 65–91)

## 2019-04-20 LAB — HIV ANTIBODY (ROUTINE TESTING W REFLEX): HIV Screen 4th Generation wRfx: NONREACTIVE

## 2019-04-20 LAB — RPR: RPR Ser Ql: NONREACTIVE

## 2019-04-23 ENCOUNTER — Other Ambulatory Visit: Payer: Self-pay

## 2019-04-23 ENCOUNTER — Other Ambulatory Visit: Payer: Self-pay | Admitting: Obstetrics and Gynecology

## 2019-04-23 DIAGNOSIS — O24410 Gestational diabetes mellitus in pregnancy, diet controlled: Secondary | ICD-10-CM

## 2019-04-23 MED ORDER — ACCU-CHEK SOFTCLIX LANCETS MISC: 12 refills | Status: DC

## 2019-04-23 MED ORDER — ACCU-CHEK GUIDE W/DEVICE KIT: 1.0000 | PACK | Freq: Four times a day (QID) | 0 refills | Status: DC

## 2019-04-23 MED ORDER — ACCU-CHEK GUIDE VI STRP: ORAL_STRIP | 12 refills | Status: DC

## 2019-04-24 ENCOUNTER — Other Ambulatory Visit: Payer: Self-pay

## 2019-04-24 MED ORDER — ACCU-CHEK GUIDE W/DEVICE KIT: 1.0000 | PACK | Freq: Four times a day (QID) | 0 refills | Status: AC

## 2019-04-25 ENCOUNTER — Telehealth: Payer: Self-pay

## 2019-04-25 NOTE — Telephone Encounter (Signed)
Jennifer Ellison  1985/04/12 [id number]  Patient called and verified her identity via birth date and last 4 of her SSN.  Patient agreeable to discuss her concerns regarding her GDM diagnosis.  Complaints and frustrations heard including not having glucometer, having to complete 2 hour diabetic education, and feeling that diagnosis is incorrect as she only failed fasting.  Extensive discussion regarding patient concerns.  Educated on necessity of providers to be vigilante in not given patient's "a pass" on values that are just barely elevated. Informed that every lab has different parameters for testing.  Discussed how fasting blood sugar failure means that her body had issues with lowering sugar in the blood overnight despite no intake and that this warrants further evaluation in the form of monitoring.  Educated on effects of unmanaged blood sugars including LGA infant resulting in cephalopelvic disproportion and need for cesarean section as well as LGA infant who is unable to maintain blood sugars and temperature after birth.  Discussed how GDM is a precursor to diabetes later in life.  Patient questions how her hypoglycemia plays role in diagnosis.  Informed that hypoglycemia can occur in anyone who has improper nutritional intake.  Reviewed previous HbA1c and informed that it was high normal range and not suggestive of issues with maintaining blood sugar, but this can vary during pregnancy.  Further informed that she should take nutritional course to provide education on proper intake not just with diabetes, but with overall health and wellness.  Informed that M.Katrinka Blazing would be contacted regarding rescheduling diabetic education as well as identifying location of glucometer.  Patient expresses gratitude for providers call and providers encourages patient to continue to be a self-advocate and ask questions while expressing concerns.  Patient without further questions or concerns tonight.  Email sent as above to  M. Smith.  Follow up as scheduled.  Cherre Robins MSN, CNM Advanced Practice Provider, Center for [facility]  **This visit was completed, in its entirety, via telehealth communications.  I personally spent >/=15 minutes on the phone providing recommendations, education, and guidance.**

## 2019-05-02 ENCOUNTER — Encounter: Payer: Self-pay | Admitting: Registered"

## 2019-05-02 ENCOUNTER — Other Ambulatory Visit: Payer: Self-pay

## 2019-05-02 ENCOUNTER — Encounter: Payer: Medicaid Other | Admitting: Registered"

## 2019-05-02 DIAGNOSIS — O99810 Abnormal glucose complicating pregnancy: Secondary | ICD-10-CM | POA: Insufficient documentation

## 2019-05-02 NOTE — Progress Notes (Signed)
Patient was seen on 04/01/19 for Gestational Diabetes self-management class at [facility]. The following learning objectives were met by the patient during this course:   States the definition of Gestational Diabetes  States why dietary management is important in controlling blood glucose  Describes the effects each nutrient has on blood glucose levels  Demonstrates ability to create a balanced meal plan  Demonstrates carbohydrate counting   States when to check blood glucose levels  Demonstrates proper blood glucose monitoring techniques  States the effect of stress and exercise on blood glucose levels  States the importance of limiting caffeine and abstaining from alcohol and smoking  Blood glucose monitor given: none  Patient instructed to monitor glucose levels: FBS: 60 - <95; 1 hour: <140; 2 hour: <120  Patient received handouts:  Nutrition Diabetes and Pregnancy, including carb counting list  Patient will be seen for follow-up as needed.

## 2019-05-07 ENCOUNTER — Other Ambulatory Visit (HOSPITAL_COMMUNITY): Payer: Self-pay | Admitting: *Deleted

## 2019-05-07 ENCOUNTER — Ambulatory Visit (HOSPITAL_COMMUNITY): Payer: Medicaid Other | Admitting: *Deleted

## 2019-05-07 ENCOUNTER — Encounter (HOSPITAL_COMMUNITY): Payer: Self-pay

## 2019-05-07 ENCOUNTER — Ambulatory Visit (HOSPITAL_COMMUNITY)
Admission: RE | Admit: 2019-05-07 | Discharge: 2019-05-07 | Disposition: A | Discharge status: Home / Self Care | Payer: Medicaid Other | Source: Ambulatory Visit | Attending: Obstetrics and Gynecology | Admitting: Obstetrics and Gynecology

## 2019-05-07 ENCOUNTER — Other Ambulatory Visit: Payer: Self-pay

## 2019-05-07 DIAGNOSIS — R8271 Bacteriuria: Secondary | ICD-10-CM

## 2019-05-07 DIAGNOSIS — O99213 Obesity complicating pregnancy, third trimester: Secondary | ICD-10-CM | POA: Diagnosis not present

## 2019-05-07 DIAGNOSIS — Z6841 Body Mass Index (BMI) 40.0 and over, adult: Secondary | ICD-10-CM

## 2019-05-07 DIAGNOSIS — O99210 Obesity complicating pregnancy, unspecified trimester: Secondary | ICD-10-CM | POA: Insufficient documentation

## 2019-05-07 DIAGNOSIS — Z3480 Encounter for supervision of other normal pregnancy, unspecified trimester: Secondary | ICD-10-CM | POA: Diagnosis present

## 2019-05-07 DIAGNOSIS — Z3A29 29 weeks gestation of pregnancy: Secondary | ICD-10-CM

## 2019-05-07 DIAGNOSIS — Z362 Encounter for other antenatal screening follow-up: Secondary | ICD-10-CM

## 2019-05-10 ENCOUNTER — Telehealth: Payer: Medicaid Other | Admitting: Obstetrics and Gynecology

## 2019-05-15 ENCOUNTER — Encounter: Payer: Self-pay | Admitting: Obstetrics and Gynecology

## 2019-05-15 ENCOUNTER — Telehealth (INDEPENDENT_AMBULATORY_CARE_PROVIDER_SITE_OTHER): Payer: Medicaid Other | Admitting: Obstetrics and Gynecology

## 2019-05-15 VITALS — BP 155/87 | HR 103

## 2019-05-15 DIAGNOSIS — O99820 Streptococcus B carrier state complicating pregnancy: Secondary | ICD-10-CM

## 2019-05-15 DIAGNOSIS — R8271 Bacteriuria: Secondary | ICD-10-CM

## 2019-05-15 DIAGNOSIS — O24410 Gestational diabetes mellitus in pregnancy, diet controlled: Secondary | ICD-10-CM

## 2019-05-15 DIAGNOSIS — O24419 Gestational diabetes mellitus in pregnancy, unspecified control: Secondary | ICD-10-CM | POA: Insufficient documentation

## 2019-05-15 DIAGNOSIS — Z3480 Encounter for supervision of other normal pregnancy, unspecified trimester: Secondary | ICD-10-CM

## 2019-05-15 DIAGNOSIS — O99210 Obesity complicating pregnancy, unspecified trimester: Secondary | ICD-10-CM | POA: Insufficient documentation

## 2019-05-15 DIAGNOSIS — Z3A30 30 weeks gestation of pregnancy: Secondary | ICD-10-CM

## 2019-05-15 DIAGNOSIS — O99213 Obesity complicating pregnancy, third trimester: Secondary | ICD-10-CM

## 2019-05-15 MED ORDER — ASPIRIN EC 81 MG PO TBEC: 81.0000 mg | DELAYED_RELEASE_TABLET | Freq: Every day | ORAL | 2 refills | Status: DC

## 2019-05-15 NOTE — Progress Notes (Signed)
TELEHEALTH OBSTETRICS PRENATAL VIRTUAL VIDEO VISIT ENCOUNTER NOTE  Provider location: Center for [facility] [place]   I connected with [name] on 05/15/19 at  3:30 PM EST by MyChart Video Encounter at home and verified that I am speaking with the correct person using two identifiers.   I discussed the limitations, risks, security and privacy concerns of performing an evaluation and management service virtually and the availability of in person appointments. I also discussed with the patient that there may be a patient responsible charge related to this service. The patient expressed understanding and agreed to proceed. Subjective:  Jennifer Ellison is a 34 y.o. [id number] at [redacted]w[redacted]d being seen today for ongoing prenatal care.  She is currently monitored for the following issues for this high-risk pregnancy and has Shortness of breath; Class 3 severe obesity due to excess calories without serious comorbidity with body mass index (BMI) of 45.0 to 49.9 in adult [facility]); Anxiety; Supervision of other normal pregnancy, antepartum; Group B streptococcal bacteriuria; GDM (gestational diabetes mellitus); and Obesity complicating pregnancy on their problem list.  Patient reports no complaints.  Contractions: Irregular. Vag. Bleeding: None.  Movement: Present. Denies any leaking of fluid.   The following portions of the patient's history were reviewed and updated as appropriate: allergies, current medications, past family history, past medical history, past social history, past surgical history and problem list.   Objective:   Vitals:   05/15/19 1450 05/15/19 1503  BP: (!) 154/85 (!) 155/87  Pulse: 95 (!) 103    Fetal Status:     Movement: Present     General:  Alert, oriented and cooperative. Patient is in no acute distress.  Respiratory: Normal respiratory effort, no problems with respiration noted  Mental Status: Normal mood and affect. Normal behavior. Normal judgment and thought  content.  Rest of physical exam deferred due to type of encounter  Imaging: [place] MFM OB FOLLOW UP  Result Date: 05/07/2019 ----------------------------------------------------------------------  OBSTETRICS REPORT                       (Signed Final 05/07/2019 02:00 pm) ---------------------------------------------------------------------- Patient Info  ID #:       [id number]                          D.O.B.:  October 09, 1985 (33 yrs)  Name:       [name]                Visit Date: 05/07/2019 12:55 pm ---------------------------------------------------------------------- Performed By  Performed By:     Lenise Arena        Ref. Address:     [address]  [address]                                                             [place]                                                             [address]  Attending:        Ma Rings MD         [facility]  Referred By:      [facility] ---------------------------------------------------------------------- Orders   #  Description                          Code         Ordered By   1  [place] MFM OB FOLLOW UP                  [phone]     [facility]  ----------------------------------------------------------------------   #  Order #                    Accession #                 Episode #   1  [id number]                  [id number]                  [id number]  ---------------------------------------------------------------------- Indications   Maternal morbid obesity (Pre Pregnancy BMI     O99.210 E66.01   45.7)   Encounter for other antenatal screening        Z36.2   follow-up   [redacted] weeks gestation of pregnancy                Z3A.29  ---------------------------------------------------------------------- Fetal Evaluation  Num Of  Fetuses:         1  Fetal Heart Rate(bpm):  141  Cardiac Activity:       Observed  Presentation:           Breech  Placenta:               Anterior  P. Cord Insertion:      Previously Visualized  Amniotic Fluid  AFI FV:      Within normal limits  AFI Sum(cm)     %Tile       Largest Pocket(cm)  19.42           75          5.8  RUQ(cm)       RLQ(cm)       LUQ(cm)  LLQ(cm)  4.25          4.2           5.8            5.17 ---------------------------------------------------------------------- Biometry  BPD:      75.8  mm     G. Age:  30w 3d         60  %    CI:        75.18   %    70 - 86                                                          FL/HC:      19.5   %    19.2 - 21.4  HC:      277.3  mm     G. Age:  30w 2d         32  %    HC/AC:      1.00        0.99 - 1.21  AC:      276.8  mm     G. Age:  31w 5d         93  %    FL/BPD:     71.2   %    71 - 87  FL:         54  mm     G. Age:  28w 4d         11  %    FL/AC:      19.5   %    20 - 24  HUM:      47.7  mm     G. Age:  28w 0d         16  %  LV:          7  mm  Est. FW:    1590  gm      3 lb 8 oz     67  % ---------------------------------------------------------------------- OB History  Gravidity:    5         Term:   2         SAB:   2  Living:       2 ---------------------------------------------------------------------- Gestational Age  LMP:           29w 5d        Date:  10/11/18                 EDD:   07/18/19  U/S Today:     30w 2d                                        EDD:   07/14/19  Best:          29w 5d     Det. By:  LMP  (10/11/18)          EDD:   07/18/19 ---------------------------------------------------------------------- Anatomy  Cranium:               Appears normal         Aortic Arch:            Previously  seen  Cavum:                 Appears normal         Ductal Arch:            Previously seen  Ventricles:            Appears normal         Diaphragm:              Previously seen  Choroid Plexus:        Previously seen        Stomach:                 Appears normal, left                                                                        sided  Cerebellum:            Previously seen        Abdomen:                Appears normal  Posterior Fossa:       Previously seen        Abdominal Wall:         Previously seen  Nuchal Fold:           Previously seen        Cord Vessels:           Previously seen  Face:                  Orbits and profile     Kidneys:                Appear normal                         previously seen  Lips:                  Previously seen        Bladder:                Appears normal  Thoracic:              Previously seen        Spine:                  Limited views                                                                        previously seen  Heart:                 Previously seen        Upper Extremities:      Previously seen  RVOT:                  Previously seen        Lower Extremities:  Previously seen  LVOT:                  Previously seen  Other:  Fetus appeared to be female previously. Heels visualized previously.          Nasal bone visualized previously. Technically difficult due to maternal          habitus and fetal position. ---------------------------------------------------------------------- Cervix Uterus Adnexa  Cervix  Not visualized (advanced GA >24wks) ---------------------------------------------------------------------- Comments  This patient was seen for a follow up growth scan due to  maternal obesity.  She denies any problems since her last  exam.  She was informed that the fetal growth and amniotic fluid  level appears appropriate for her gestational age.  As her BMI is over 40, we will start weekly fetal testing at  around 32 weeks.  A follow-up growth scan will be scheduled  in 4 weeks. ----------------------------------------------------------------------                   Ma Rings, MD Electronically Signed Final Report   05/07/2019 02:00 pm  ----------------------------------------------------------------------   Assessment and Plan:  Pregnancy: [id number] at [redacted]w[redacted]d 1. Supervision of other normal pregnancy, antepartum Patient is doing well without complaints Elevated BP today without symptoms. Patient to come in next week for repeat BP check and to bring cuff to ensure it is adequately calibrated  2. Group B streptococcal bacteriuria Prophylaxis in labor  3. Diet controlled gestational diabetes mellitus (GDM) in third trimester Patient seen by nutritionist/diabetic educator on 05/02/2019 CBGs fasting 108, 97, 88, 84, 83  Pp 115, 112, 95, 117, 120, 122 Continue diet control  4. Obesity complicating pregnancy Follow up growth ultrasound Rx ASA provided  Preterm labor symptoms and general obstetric precautions including but not limited to vaginal bleeding, contractions, leaking of fluid and fetal movement were reviewed in detail with the patient. I discussed the assessment and treatment plan with the patient. The patient was provided an opportunity to ask questions and all were answered. The patient agreed with the plan and demonstrated an understanding of the instructions. The patient was advised to call back or seek an in-person office evaluation/go to [facility] at [facility] for any urgent or concerning symptoms. Please refer to After Visit Summary for other counseling recommendations.   I provided 11 minutes of face-to-face time during this encounter.  Return in about 2 weeks (around 05/29/2019) for Virtual, ROB, High risk.  Future Appointments  Date Time Provider Department Center  05/15/2019  3:30 PM Rhena Glace, Gigi Gin, MD [facility] None  05/28/2019  1:00 PM WH-MFC NURSE WH-M[facility] MFC-US  05/28/2019  1:00 PM WH-MFC [place] 3 WH-MFCUS MFC-US  06/04/2019  1:15 PM WH-MFC NURSE WH-MFC MFC-US  06/04/2019  1:15 PM WH-MFC [place] 4 WH-MFCUS MFC-US    Catalina Antigua, MD Center for [employer]

## 2019-05-21 ENCOUNTER — Other Ambulatory Visit: Payer: Self-pay

## 2019-05-21 ENCOUNTER — Ambulatory Visit: Payer: Medicaid Other | Admitting: *Deleted

## 2019-05-21 VITALS — BP 104/71 | HR 106

## 2019-05-21 DIAGNOSIS — Z3480 Encounter for supervision of other normal pregnancy, unspecified trimester: Secondary | ICD-10-CM

## 2019-05-21 NOTE — Progress Notes (Signed)
Pt is in office for BP check.  Pt had elevated bp at last visit.  BP in office today is WNL.  Pt advised that she may be having issues with Cuff size.  Pt advised that she may want to check BP on upper forearm with next BP check to determine if cuff size is an issue.   Pt has no complaints today. Pt is keeping log of BP and blood sugars.  Advised to discuss with provider at next visit.   Dr Jolayne Panther made aware of BP today and pt may f/u as planned.   BP 104/71 (BP Location: Left Arm)   Pulse (!) 106   LMP 10/11/2018  126/78 BP right arm

## 2019-05-24 ENCOUNTER — Ambulatory Visit: Payer: Medicaid Other

## 2019-05-28 ENCOUNTER — Ambulatory Visit (HOSPITAL_COMMUNITY): Payer: Medicaid Other | Admitting: *Deleted

## 2019-05-28 ENCOUNTER — Other Ambulatory Visit: Payer: Self-pay

## 2019-05-28 ENCOUNTER — Encounter (HOSPITAL_COMMUNITY): Payer: Self-pay | Admitting: *Deleted

## 2019-05-28 ENCOUNTER — Ambulatory Visit (HOSPITAL_COMMUNITY)
Admission: RE | Admit: 2019-05-28 | Discharge: 2019-05-28 | Disposition: A | Discharge status: Home / Self Care | Payer: Medicaid Other | Source: Ambulatory Visit | Attending: Obstetrics and Gynecology | Admitting: Obstetrics and Gynecology

## 2019-05-28 DIAGNOSIS — Z3480 Encounter for supervision of other normal pregnancy, unspecified trimester: Secondary | ICD-10-CM

## 2019-05-28 DIAGNOSIS — Z362 Encounter for other antenatal screening follow-up: Secondary | ICD-10-CM | POA: Diagnosis not present

## 2019-05-28 DIAGNOSIS — Z3A32 32 weeks gestation of pregnancy: Secondary | ICD-10-CM | POA: Diagnosis not present

## 2019-05-28 DIAGNOSIS — O99213 Obesity complicating pregnancy, third trimester: Secondary | ICD-10-CM | POA: Diagnosis not present

## 2019-05-28 DIAGNOSIS — Z6841 Body Mass Index (BMI) 40.0 and over, adult: Secondary | ICD-10-CM

## 2019-05-28 DIAGNOSIS — R8271 Bacteriuria: Secondary | ICD-10-CM

## 2019-05-29 ENCOUNTER — Telehealth (INDEPENDENT_AMBULATORY_CARE_PROVIDER_SITE_OTHER): Payer: Medicaid Other | Admitting: Obstetrics & Gynecology

## 2019-05-29 ENCOUNTER — Encounter: Payer: Self-pay | Admitting: Obstetrics & Gynecology

## 2019-05-29 DIAGNOSIS — O24419 Gestational diabetes mellitus in pregnancy, unspecified control: Secondary | ICD-10-CM

## 2019-05-29 DIAGNOSIS — O0993 Supervision of high risk pregnancy, unspecified, third trimester: Secondary | ICD-10-CM

## 2019-05-29 DIAGNOSIS — Z3A32 32 weeks gestation of pregnancy: Secondary | ICD-10-CM

## 2019-05-29 DIAGNOSIS — E669 Obesity, unspecified: Secondary | ICD-10-CM

## 2019-05-29 DIAGNOSIS — O99210 Obesity complicating pregnancy, unspecified trimester: Secondary | ICD-10-CM

## 2019-05-29 DIAGNOSIS — O99213 Obesity complicating pregnancy, third trimester: Secondary | ICD-10-CM

## 2019-05-29 NOTE — Progress Notes (Signed)
TELEHEALTH OBSTETRICS PRENATAL VIRTUAL VIDEO VISIT ENCOUNTER NOTE  Provider location: Center for [facility]   I connected with Jennifer Ellison on 05/29/19 at  1:45 PM EST by MyChart Video Encounter at home and verified that I am speaking with the correct person using two identifiers.   I discussed the limitations, risks, security and privacy concerns of performing an evaluation and management service virtually and the availability of in person appointments. I also discussed with the patient that there may be a patient responsible charge related to this service. The patient expressed understanding and agreed to proceed. Subjective:  Jennifer Ellison is a 34 y.o. [id number] at [redacted]w[redacted]d being seen today for ongoing prenatal care.  She is currently monitored for the following issues for this high-risk pregnancy and has Shortness of breath; Class 3 severe obesity due to excess calories without serious comorbidity with body mass index (BMI) of 45.0 to 49.9 in adult [facility]); Anxiety; Supervision of high-risk pregnancy; Group B streptococcal bacteriuria; GDM (gestational diabetes mellitus); and Obesity complicating pregnancy on their problem list.  Patient reports no complaints.  Contractions: Irritability. Vag. Bleeding: None.  Movement: Present. Denies any leaking of fluid.   The following portions of the patient's history were reviewed and updated as appropriate: allergies, current medications, past family history, past medical history, past social history, past surgical history and problem list.   Objective:  There were no vitals filed for this visit.  Fetal Status:     Movement: Present     General:  Alert, oriented and cooperative. Patient is in no acute distress.  Respiratory: Normal respiratory effort, no problems with respiration noted  Mental Status: Normal mood and affect. Normal behavior. Normal judgment and thought content.  Rest of physical exam deferred due to type of  encounter  Imaging: [place] MFM FETAL BPP WO NON STRESS  Result Date: 05/28/2019 ----------------------------------------------------------------------  OBSTETRICS REPORT                       (Signed Final 05/28/2019 01:59 pm) ---------------------------------------------------------------------- Patient Info  ID #:       [id number]                          D.O.B.:  Aug 10, 1985 (33 yrs)  Name:       Jennifer Ellison                Visit Date: 05/28/2019 01:13 pm ---------------------------------------------------------------------- Performed By  Performed By:     Sandi Mealy        Ref. Address:     [address]                                                             [phone]  [place]                                                             [address]  Attending:        Ma Rings MD         [facility]  Referred By:      [facility] ---------------------------------------------------------------------- Orders   #  Description                          Code         Ordered By   1  [place] MFM FETAL BPP WO NON              76819.01     YU FANG      STRESS  ----------------------------------------------------------------------   #  Order #                    Accession #                 Episode #   1  [id number]                  [id number]                  [id number]  ---------------------------------------------------------------------- Indications   Maternal morbid obesity (Pre Pregnancy BMI     O99.210 E66.01   45.7)   Encounter for other antenatal screening        Z36.2   follow-up   [redacted] weeks gestation of pregnancy                Z3A.32  ---------------------------------------------------------------------- Fetal Evaluation  Num Of Fetuses:         1  Fetal Heart  Rate(bpm):  138  Cardiac Activity:       Observed  Presentation:           Cephalic  Placenta:               Anterior  P. Cord Insertion:      Previously Visualized  Amniotic Fluid  AFI FV:      Within normal limits  AFI Sum(cm)     %Tile       Largest Pocket(cm)  23.86           93          9.56  RUQ(cm)       RLQ(cm)       LUQ(cm)        LLQ(cm)  9.56          6.27          3.24           4.79 ---------------------------------------------------------------------- Biophysical Evaluation  Amniotic F.V:   Within normal limits       F. Tone:  Observed  F. Movement:    Observed                   Score:          8/8  F. Breathing:   Observed ---------------------------------------------------------------------- OB History  Gravidity:    5         Term:   2         SAB:   2  Living:       2 ---------------------------------------------------------------------- Gestational Age  LMP:           32w 5d        Date:  10/11/18                 EDD:   07/18/19  Best:          Armida Sans 5d     Det. By:  LMP  (10/11/18)          EDD:   07/18/19 ---------------------------------------------------------------------- Comments  This patient was seen for a biophysical profile due to  maternal obesity with a BMI of over 45.  Her pregnancy has  also been complicated by gestational diabetes.  A biophysical profile performed today was 8 out of 8.  There was normal amniotic fluid noted on today's ultrasound  exam.  Another biophysical profile was scheduled in 1 week. ----------------------------------------------------------------------                   Ma Rings, MD Electronically Signed Final Report   05/28/2019 01:59 pm ----------------------------------------------------------------------  [place] MFM OB FOLLOW UP  Result Date: 05/07/2019 ----------------------------------------------------------------------  OBSTETRICS REPORT                       (Signed Final 05/07/2019 02:00 pm)  ---------------------------------------------------------------------- Patient Info  ID #:       [id number]                          D.O.B.:  Dec 07, 1985 (33 yrs)  Name:       Jennifer Ellison                Visit Date: 05/07/2019 12:55 pm ---------------------------------------------------------------------- Performed By  Performed By:     Lenise Arena        Ref. Address:     [address]                                                             [phone]  [place]                                                             [address]  Attending:        Ma Rings MD         [facility]  Referred By:      [facility] ---------------------------------------------------------------------- Orders   #  Description                          Code         Ordered By   1  [place] MFM OB FOLLOW UP                  [phone]     [facility]  ----------------------------------------------------------------------   #  Order #                    Accession #                 Episode #   1  [id number]                  [id number]                  [id number]  ---------------------------------------------------------------------- Indications   Maternal morbid obesity (Pre Pregnancy BMI     O99.210 E66.01   45.7)   Encounter for other antenatal screening        Z36.2   follow-up   [redacted] weeks gestation of pregnancy                Z3A.29  ---------------------------------------------------------------------- Fetal Evaluation  Num Of Fetuses:         1  Fetal Heart Rate(bpm):  141  Cardiac Activity:       Observed  Presentation:           Breech  Placenta:               Anterior  P. Cord Insertion:      Previously Visualized  Amniotic Fluid  AFI FV:      Within normal limits  AFI Sum(cm)     %Tile        Largest Pocket(cm)  19.42           75          5.8  RUQ(cm)       RLQ(cm)       LUQ(cm)        LLQ(cm)  4.25          4.2           5.8            5.17 ---------------------------------------------------------------------- Biometry  BPD:      75.8  mm     G. Age:  30w 3d  60  %    CI:        75.18   %    70 - 86                                                          FL/HC:      19.5   %    19.2 - 21.4  HC:      277.3  mm     G. Age:  30w 2d         32  %    HC/AC:      1.00        0.99 - 1.21  AC:      276.8  mm     G. Age:  31w 5d         93  %    FL/BPD:     71.2   %    71 - 87  FL:         54  mm     G. Age:  28w 4d         11  %    FL/AC:      19.5   %    20 - 24  HUM:      47.7  mm     G. Age:  28w 0d         16  %  LV:          7  mm  Est. FW:    1590  gm      3 lb 8 oz     67  % ---------------------------------------------------------------------- OB History  Gravidity:    5         Term:   2         SAB:   2  Living:       2 ---------------------------------------------------------------------- Gestational Age  LMP:           29w 5d        Date:  10/11/18                 EDD:   07/18/19  U/S Today:     30w 2d                                        EDD:   07/14/19  Best:          29w 5d     Det. By:  LMP  (10/11/18)          EDD:   07/18/19 ---------------------------------------------------------------------- Anatomy  Cranium:               Appears normal         Aortic Arch:            Previously seen  Cavum:                 Appears normal         Ductal Arch:            Previously seen  Ventricles:            Appears normal  Diaphragm:              Previously seen  Choroid Plexus:        Previously seen        Stomach:                Appears normal, left                                                                        sided  Cerebellum:            Previously seen        Abdomen:                Appears normal  Posterior Fossa:       Previously seen        Abdominal Wall:          Previously seen  Nuchal Fold:           Previously seen        Cord Vessels:           Previously seen  Face:                  Orbits and profile     Kidneys:                Appear normal                         previously seen  Lips:                  Previously seen        Bladder:                Appears normal  Thoracic:              Previously seen        Spine:                  Limited views                                                                        previously seen  Heart:                 Previously seen        Upper Extremities:      Previously seen  RVOT:                  Previously seen        Lower Extremities:      Previously seen  LVOT:                  Previously seen  Other:  Fetus appeared to be female previously. Heels visualized previously.          Nasal bone visualized previously. Technically difficult due to maternal          habitus  and fetal position. ---------------------------------------------------------------------- Cervix Uterus Adnexa  Cervix  Not visualized (advanced GA >24wks) ---------------------------------------------------------------------- Comments  This patient was seen for a follow up growth scan due to  maternal obesity.  She denies any problems since her last  exam.  She was informed that the fetal growth and amniotic fluid  level appears appropriate for her gestational age.  As her BMI is over 40, we will start weekly fetal testing at  around 32 weeks.  A follow-up growth scan will be scheduled  in 4 weeks. ----------------------------------------------------------------------                   Ma Rings, MD Electronically Signed Final Report   05/07/2019 02:00 pm ----------------------------------------------------------------------   Assessment and Plan:  Pregnancy: [id number] at [redacted]w[redacted]d 1. Gestational diabetes mellitus (GDM), antepartum, gestational diabetes method of control unspecified Reviewed CBGs with her over the phone, she writes in a log. Mostly  within range, just a couple of abnormal values. Continue diet and exercise control. BPP/scans as per MFM.  2. Obesity affecting pregnancy, antepartum TWG 42 lbs.    3. Supervision of high risk pregnancy in third trimester Preterm labor symptoms and general obstetric precautions including but not limited to vaginal bleeding, contractions, leaking of fluid and fetal movement were reviewed in detail with the patient. I discussed the assessment and treatment plan with the patient. The patient was provided an opportunity to ask questions and all were answered. The patient agreed with the plan and demonstrated an understanding of the instructions. The patient was advised to call back or seek an in-person office evaluation/go to MAU at [facility] for any urgent or concerning symptoms. Please refer to After Visit Summary for other counseling recommendations.   I provided 15 minutes of face-to-face time during this encounter.  Return in about 2 weeks (around 06/12/2019) for Virtual OB Visit.  Future Appointments  Date Time Provider Department Center  06/04/2019  1:15 PM WH-MFC NURSE WH-MFC M[facility]-US  06/04/2019  1:15 PM WH-MFC [place] 4 WH-MFCUS MFC-US    Jaynie Collins, MD Center for [facility], [facility]

## 2019-05-29 NOTE — Progress Notes (Signed)
Pt is on the phone preparing for virtual visit with provider. [redacted]w[redacted]d. Pt reports she does not have her BP cuff with her currently, BP was 115/59 at [place] appt yesterday. Pt denies any HA's, blurry vision, or swelling.

## 2019-05-29 NOTE — Patient Instructions (Signed)
Return to office for any scheduled appointments. Call the office or go to the MAU at [facility] [place] if:  You begin to have strong, frequent contractions  Your water breaks.  Sometimes it is a big gush of fluid, sometimes it is just a trickle that keeps getting your panties wet or running down your legs  You have vaginal bleeding.  It is normal to have a small amount of spotting if your cervix was checked.   You do not feel your baby moving like normal.  If you do not, get something to eat and drink and lay down and focus on feeling your baby move.   If your baby is still not moving like normal, you should call the office or go to MAU.  Any other obstetric concerns.   

## 2019-06-04 ENCOUNTER — Other Ambulatory Visit: Payer: Self-pay

## 2019-06-04 ENCOUNTER — Encounter (HOSPITAL_COMMUNITY): Payer: Self-pay | Admitting: *Deleted

## 2019-06-04 ENCOUNTER — Other Ambulatory Visit (HOSPITAL_COMMUNITY): Payer: Self-pay | Admitting: *Deleted

## 2019-06-04 ENCOUNTER — Ambulatory Visit (HOSPITAL_COMMUNITY)
Admission: RE | Admit: 2019-06-04 | Discharge: 2019-06-04 | Disposition: A | Discharge status: Home / Self Care | Payer: Medicaid Other | Source: Ambulatory Visit | Attending: Obstetrics and Gynecology | Admitting: Obstetrics and Gynecology

## 2019-06-04 ENCOUNTER — Ambulatory Visit (HOSPITAL_COMMUNITY): Payer: Medicaid Other | Admitting: *Deleted

## 2019-06-04 DIAGNOSIS — Z362 Encounter for other antenatal screening follow-up: Secondary | ICD-10-CM

## 2019-06-04 DIAGNOSIS — Z3A33 33 weeks gestation of pregnancy: Secondary | ICD-10-CM

## 2019-06-04 DIAGNOSIS — O24419 Gestational diabetes mellitus in pregnancy, unspecified control: Secondary | ICD-10-CM

## 2019-06-04 DIAGNOSIS — O24410 Gestational diabetes mellitus in pregnancy, diet controlled: Secondary | ICD-10-CM | POA: Insufficient documentation

## 2019-06-04 DIAGNOSIS — R8271 Bacteriuria: Secondary | ICD-10-CM

## 2019-06-04 DIAGNOSIS — O99213 Obesity complicating pregnancy, third trimester: Secondary | ICD-10-CM | POA: Diagnosis not present

## 2019-06-04 DIAGNOSIS — Z6841 Body Mass Index (BMI) 40.0 and over, adult: Secondary | ICD-10-CM

## 2019-06-12 ENCOUNTER — Telehealth (INDEPENDENT_AMBULATORY_CARE_PROVIDER_SITE_OTHER): Payer: Medicaid Other | Admitting: Obstetrics and Gynecology

## 2019-06-12 ENCOUNTER — Encounter: Payer: Self-pay | Admitting: Obstetrics and Gynecology

## 2019-06-12 DIAGNOSIS — Z3A34 34 weeks gestation of pregnancy: Secondary | ICD-10-CM

## 2019-06-12 DIAGNOSIS — R8271 Bacteriuria: Secondary | ICD-10-CM

## 2019-06-12 DIAGNOSIS — O24419 Gestational diabetes mellitus in pregnancy, unspecified control: Secondary | ICD-10-CM

## 2019-06-12 DIAGNOSIS — O98813 Other maternal infectious and parasitic diseases complicating pregnancy, third trimester: Secondary | ICD-10-CM

## 2019-06-12 DIAGNOSIS — O0990 Supervision of high risk pregnancy, unspecified, unspecified trimester: Secondary | ICD-10-CM

## 2019-06-12 NOTE — Progress Notes (Signed)
OBSTETRICS PRENATAL VIRTUAL VISIT ENCOUNTER NOTE  Provider location: [facility]   I connected with Jennifer Ellison on 06/12/19 at  1:00 PM EDT by MyChart Video Encounter at home and verified that I am speaking with the correct person using two identifiers.   I discussed the limitations, risks, security and privacy concerns of performing an evaluation and management service virtually and the availability of in person appointments. I also discussed with the patient that there may be a patient responsible charge related to this service. The patient expressed understanding and agreed to proceed. Subjective:  Jennifer Ellison is a 34 y.o. X3A3557 at [redacted]w[redacted]d being seen today for ongoing prenatal care.  She is currently monitored for the following issues for this high-risk pregnancy and has Shortness of breath; Class 3 severe obesity due to excess calories without serious comorbidity with body mass index (BMI) of 45.0 to 49.9 in adult [facility]); Anxiety; Supervision of high-risk pregnancy; Group B streptococcal bacteriuria; GDM (gestational diabetes mellitus); and Obesity complicating pregnancy on their problem list.  Patient reports general discomforts of pregnancy.  Contractions: Not present. Vag. Bleeding: None.  Movement: Present. Denies any leaking of fluid.   The following portions of the patient's history were reviewed and updated as appropriate: allergies, current medications, past family history, past medical history, past social history, past surgical history and problem list.   Objective:  There were no vitals filed for this visit.  Fetal Status:     Movement: Present     General:  Alert, oriented and cooperative. Patient is in no acute distress.  Respiratory: Normal respiratory effort, no problems with respiration noted  Mental Status: Normal mood and affect. Normal behavior. Normal judgment and thought content.  Rest of physical exam deferred due to type of  encounter  Imaging: [place] MFM FETAL BPP WO NON STRESS  Result Date: 06/04/2019 ----------------------------------------------------------------------  OBSTETRICS REPORT                       (Signed Final 06/04/2019 04:29 pm) ---------------------------------------------------------------------- Patient Info  ID #:       [id number]                          D.O.B.:  December 15, 1985 (33 yrs)  Name:       Jennifer Ellison                Visit Date: 06/04/2019 01:29 pm ---------------------------------------------------------------------- Performed By  Performed By:     Percell Boston          Ref. Address:     [address]                                                             [phone]  [place]                                                             [address]  Attending:        Noralee Space MD        [facility]  Referred By:      [facility] ---------------------------------------------------------------------- Orders   #  Description                          Code         Ordered By   1  [place] MFM OB FOLLOW UP                  76816.01     YU FANG   2  [place] MFM FETAL BPP WO NON              76819.01     YU FANG      STRESS  ----------------------------------------------------------------------   #  Order #                    Accession #                 Episode #   1  [id number]                  [id number]                  [id number]   2  [id number]                  [id number]                  [id number]  ---------------------------------------------------------------------- Indications   [redacted] weeks gestation of pregnancy                Z3A.33   Maternal morbid obesity (Pre Pregnancy BMI     O99.210 E66.01   45.7)   Encounter for other antenatal screening        Z36.2   follow-up    Gestational diabetes in pregnancy, diet        O24.410   controlled  ---------------------------------------------------------------------- Fetal Evaluation  Num Of Fetuses:         1  Fetal Heart Rate(bpm):  138  Cardiac Activity:       Observed  Presentation:           Cephalic  Placenta:               Anterior  P. Cord Insertion:      Previously Visualized  Amniotic Fluid  AFI FV:      Within normal limits  AFI Sum(cm)     %Tile       Largest Pocket(cm)  12.35  36          4.36  RUQ(cm)       RLQ(cm)       LUQ(cm)        LLQ(cm)  4.17          0             4.36           3.82 ---------------------------------------------------------------------- Biophysical Evaluation  Amniotic F.V:   Within normal limits       F. Tone:        Observed  F. Movement:    Observed                   Score:          8/8  F. Breathing:   Observed ---------------------------------------------------------------------- Biometry  BPD:      86.6  mm     G. Age:  35w 0d         80  %    CI:        78.48   %    70 - 86                                                          FL/HC:      20.3   %    19.4 - 21.8  HC:      309.2  mm     G. Age:  34w 4d         33  %    HC/AC:      0.99        0.96 - 1.11  AC:      312.8  mm     G. Age:  35w 2d         89  %    FL/BPD:     72.6   %    71 - 87  FL:       62.9  mm     G. Age:  32w 4d         13  %    FL/AC:      20.1   %    20 - 24  Est. FW:    2421  gm      5 lb 5 oz     64  % ---------------------------------------------------------------------- OB History  Gravidity:    5         Term:   2         SAB:   2  Living:       2 ---------------------------------------------------------------------- Gestational Age  LMP:           33w 5d        Date:  10/11/18                 EDD:   07/18/19  U/S Today:     34w 3d                                        EDD:   07/13/19  Best:          33w 5d     Det. By:  LMP  (10/11/18)          EDD:   07/18/19  ---------------------------------------------------------------------- Anatomy  Cranium:               Appears normal         Aortic Arch:            Previously seen  Cavum:                 Previously seen        Ductal Arch:            Previously seen  Ventricles:            Previously seen        Diaphragm:              Previously seen  Choroid Plexus:        Previously seen        Stomach:                Appears normal, left                                                                        sided  Cerebellum:            Previously seen        Abdomen:                Previously seen  Posterior Fossa:       Previously seen        Abdominal Wall:         Previously seen  Nuchal Fold:           Previously seen        Cord Vessels:           Previously seen  Face:                  Orbits and profile     Kidneys:                Previously seen                         previously seen  Lips:                  Previously seen        Bladder:                Appears normal  Thoracic:              Previously seen        Spine:                  Limited views                                                                        previously seen  Heart:  Previously seen        Upper Extremities:      Previously seen  RVOT:                  Previously seen        Lower Extremities:      Previously seen  LVOT:                  Previously seen  Other:  Fetus appeared to be female previously. Heels visualized previously.          Nasal bone visualized previously. Technically difficult due to maternal          habitus and fetal position. ---------------------------------------------------------------------- Cervix Uterus Adnexa  Cervix  Not visualized (advanced GA >24wks) ---------------------------------------------------------------------- Impression  Gestational diabetes. Well-controlled on diet.  Amniotic fluid is normal and good fetal activity is seen. Fetal  growth is appropriate for gestational age.  Antenatal testing is  reassuring. BPP 8/8. ---------------------------------------------------------------------- Recommendations  -An appointment was made for her to return in 4 weeks for  completion of fetal anatomy. ----------------------------------------------------------------------                  Noralee Space, MD Electronically Signed Final Report   06/04/2019 04:29 pm ----------------------------------------------------------------------  [place] MFM FETAL BPP WO NON STRESS  Result Date: 05/28/2019 ----------------------------------------------------------------------  OBSTETRICS REPORT                       (Signed Final 05/28/2019 01:59 pm) ---------------------------------------------------------------------- Patient Info  ID #:       [id number]                          D.O.B.:  10-14-1985 (33 yrs)  Name:       Jennifer Ellison                Visit Date: 05/28/2019 01:13 pm ---------------------------------------------------------------------- Performed By  Performed By:     Sandi Mealy        Ref. Address:     [address]                                                             [address]                                                             [place]  [address]  Attending:        Ma Rings MD         [facility]  Referred By:      [facility] ---------------------------------------------------------------------- Orders   #  Description                          Code         Ordered By   1  [place] MFM FETAL BPP WO NON              76819.01     YU FANG      STRESS  ----------------------------------------------------------------------   #  Order #                    Accession #                 Episode #   1  [id number]                  [id number]                   [id number]  ---------------------------------------------------------------------- Indications   Maternal morbid obesity (Pre Pregnancy BMI     O99.210 E66.01   45.7)   Encounter for other antenatal screening        Z36.2   follow-up   [redacted] weeks gestation of pregnancy                Z3A.32  ---------------------------------------------------------------------- Fetal Evaluation  Num Of Fetuses:         1  Fetal Heart Rate(bpm):  138  Cardiac Activity:       Observed  Presentation:           Cephalic  Placenta:               Anterior  P. Cord Insertion:      Previously Visualized  Amniotic Fluid  AFI FV:      Within normal limits  AFI Sum(cm)     %Tile       Largest Pocket(cm)  23.86           93          9.56  RUQ(cm)       RLQ(cm)       LUQ(cm)        LLQ(cm)  9.56          6.27          3.24           4.79 ---------------------------------------------------------------------- Biophysical Evaluation  Amniotic F.V:   Within normal limits       F. Tone:        Observed  F. Movement:    Observed                   Score:          8/8  F. Breathing:   Observed ---------------------------------------------------------------------- OB History  Gravidity:    5  Term:   2         SAB:   2  Living:       2 ---------------------------------------------------------------------- Gestational Age  LMP:           32w 5d        Date:  10/11/18                 EDD:   07/18/19  Best:          Armida Sans 5d     Det. By:  LMP  (10/11/18)          EDD:   07/18/19 ---------------------------------------------------------------------- Comments  This patient was seen for a biophysical profile due to  maternal obesity with a BMI of over 45.  Her pregnancy has  also been complicated by gestational diabetes.  A biophysical profile performed today was 8 out of 8.  There was normal amniotic fluid noted on today's ultrasound  exam.  Another biophysical profile was scheduled in 1 week.  ----------------------------------------------------------------------                   Ma Rings, MD Electronically Signed Final Report   05/28/2019 01:59 pm ----------------------------------------------------------------------  [place] MFM OB FOLLOW UP  Result Date: 06/04/2019 ----------------------------------------------------------------------  OBSTETRICS REPORT                       (Signed Final 06/04/2019 04:29 pm) ---------------------------------------------------------------------- Patient Info  ID #:       [id number]                          D.O.B.:  07-07-1985 (33 yrs)  Name:       Jennifer Ellison                Visit Date: 06/04/2019 01:29 pm ---------------------------------------------------------------------- Performed By  Performed By:     Percell Boston          Ref. Address:     [address]                                                             [address]                                                             [place]  [phone]  Attending:        Noralee Spaceavi Shankar MD        [facility]  [facility] By[facility]      [facility] ---------------------------------------------------------------------- Orders   #  Description                          Code         Ordered By   1  US MFM OB FOLLOW UP                  76816.01     YU FANG   2  US MFM FETAL BPP WO NON              76819.01     YU FANG      STRESS  ----------------------------------------------------------------------   #  Order #                    Accession #                 Episode #   1  [id number]                  [id number]   2  [id number]                  [id number]                  [id number]  ----------------------------------------------------------------------  Indications   [redacted] weeks gestation of pregnancy                Z3A.33   Maternal morbid obesity (Pre Pregnancy BMI     O99.210 E66.01   45.7)   Encounter for other antenatal screening        Z36.2   follow-up   Gestational diabetes in pregnancy, diet        O24.410   controlled  ---------------------------------------------------------------------- Fetal Evaluation  Num Of Fetuses:         1  Fetal Heart Rate(bpm):  138  Cardiac Activity:       Observed  Presentation:           Cephalic  Placenta:               Anterior  P. Cord Insertion:      Previously Visualized  Amniotic Fluid  AFI FV:      Within normal limits  AFI Sum(cm)     %Tile       Largest Pocket(cm)  12.35           36          4.36  RUQ(cm)       RLQ(cm)       LUQ(cm)        LLQ(cm)  4.17          0  4.36           3.82 ---------------------------------------------------------------------- Biophysical Evaluation  Amniotic F.V:   Within normal limits       F. Tone:        Observed  F. Movement:    Observed                   Score:          8/8  F. Breathing:   Observed ---------------------------------------------------------------------- Biometry  BPD:      86.6  mm     G. Age:  35w 0d         80  %    CI:        78.48   %    70 - 86                                                          FL/HC:      20.3   %    19.4 - 21.8  HC:      309.2  mm     G. Age:  34w 4d         33  %    HC/AC:      0.99        0.96 - 1.11  AC:      312.8  mm     G. Age:  35w 2d         89  %    FL/BPD:     72.6   %    71 - 87  FL:       62.9  mm     G. Age:  32w 4d         13  %    FL/AC:      20.1   %    20 - 24  Est. FW:    2421  gm      5 lb 5 oz     64  % ---------------------------------------------------------------------- OB History  Gravidity:    5         Term:   2         SAB:   2  Living:       2 ---------------------------------------------------------------------- Gestational Age  LMP:           33w 5d        Date:  10/11/18                 EDD:    07/18/19  U/S Today:     34w 3d                                        EDD:   07/13/19  Best:          33w 5d     Det. By:  LMP  (10/11/18)          EDD:   07/18/19 ---------------------------------------------------------------------- Anatomy  Cranium:               Appears normal         Aortic Arch:            Previously  seen  Cavum:                 Previously seen        Ductal Arch:            Previously seen  Ventricles:            Previously seen        Diaphragm:              Previously seen  Choroid Plexus:        Previously seen        Stomach:                Appears normal, left                                                                        sided  Cerebellum:            Previously seen        Abdomen:                Previously seen  Posterior Fossa:       Previously seen        Abdominal Wall:         Previously seen  Nuchal Fold:           Previously seen        Cord Vessels:           Previously seen  Face:                  Orbits and profile     Kidneys:                Previously seen                         previously seen  Lips:                  Previously seen        Bladder:                Appears normal  Thoracic:              Previously seen        Spine:                  Limited views                                                                        previously seen  Heart:                 Previously seen        Upper Extremities:      Previously seen  RVOT:                  Previously seen        Lower Extremities:  Previously seen  LVOT:                  Previously seen  Other:  Fetus appeared to be female previously. Heels visualized previously.          Nasal bone visualized previously. Technically difficult due to maternal          habitus and fetal position. ---------------------------------------------------------------------- Cervix Uterus Adnexa  Cervix  Not visualized (advanced GA >24wks) ---------------------------------------------------------------------- Impression   Gestational diabetes. Well-controlled on diet.  Amniotic fluid is normal and good fetal activity is seen. Fetal  growth is appropriate for gestational age. Antenatal testing is  reassuring. BPP 8/8. ---------------------------------------------------------------------- Recommendations  -An appointment was made for her to return in 4 weeks for  completion of fetal anatomy. ----------------------------------------------------------------------                  Noralee Space, MD Electronically Signed Final Report   06/04/2019 04:29 pm ----------------------------------------------------------------------   Assessment and Plan:  Pregnancy: [id number] at [redacted]w[redacted]d 1. Gestational diabetes mellitus (GDM), antepartum, gestational diabetes method of control unspecified CBG's in goal range per pt report. Growth scan 06/04/19 64 %, follow up scan scheduled in April Continue with diet  2. Supervision of high risk pregnancy, antepartum Stable GC/C next office visit  3. Group B streptococcal bacteriuria Tx while in labor  Preterm labor symptoms and general obstetric precautions including but not limited to vaginal bleeding, contractions, leaking of fluid and fetal movement were reviewed in detail with the patient. I discussed the assessment and treatment plan with the patient. The patient was provided an opportunity to ask questions and all were answered. The patient agreed with the plan and demonstrated an understanding of the instructions. The patient was advised to call back or seek an in-person office evaluation/go to [facility] at [facility] for any urgent or concerning symptoms. Please refer to After Visit Summary for other counseling recommendations.   I provided 8 minutes of face-to-face time during this encounter.  Return in about 2 weeks (around 06/26/2019) for OB visit, face to face for vaginal cultures, MD provider.  Future Appointments  Date Time Provider Department Center  07/02/2019 12:30  PM [facility]-M[facility] NURSE [facility] M[facility]-US  07/02/2019 12:30 PM WH-M[facility] [place] 1 WH-MFCUS MFC-US    Hermina Staggers, MD [facility]

## 2019-06-12 NOTE — Progress Notes (Signed)
Pt is on the phone preparing for virtual visit with provider. [redacted]w[redacted]d. Pt reports her BP cuff is malfunctioning, advised pt to go to [facility] and have them switch the cuff out- pt voices understanding.

## 2019-06-26 ENCOUNTER — Other Ambulatory Visit: Payer: Self-pay

## 2019-06-26 ENCOUNTER — Encounter: Payer: Medicaid Other | Admitting: Women's Health

## 2019-06-26 ENCOUNTER — Other Ambulatory Visit (HOSPITAL_COMMUNITY)
Admission: RE | Admit: 2019-06-26 | Discharge: 2019-06-26 | Disposition: A | Discharge status: Home / Self Care | Payer: Medicaid Other | Source: Ambulatory Visit | Attending: Obstetrics | Admitting: Obstetrics

## 2019-06-26 ENCOUNTER — Ambulatory Visit (INDEPENDENT_AMBULATORY_CARE_PROVIDER_SITE_OTHER): Payer: Medicaid Other | Admitting: Obstetrics

## 2019-06-26 VITALS — BP 133/85 | HR 117 | Wt 300.9 lb

## 2019-06-26 DIAGNOSIS — O24410 Gestational diabetes mellitus in pregnancy, diet controlled: Secondary | ICD-10-CM

## 2019-06-26 DIAGNOSIS — O99213 Obesity complicating pregnancy, third trimester: Secondary | ICD-10-CM

## 2019-06-26 DIAGNOSIS — O0990 Supervision of high risk pregnancy, unspecified, unspecified trimester: Secondary | ICD-10-CM

## 2019-06-26 DIAGNOSIS — Z3A37 37 weeks gestation of pregnancy: Secondary | ICD-10-CM

## 2019-06-26 LAB — OB RESULTS CONSOLE GC/CHLAMYDIA: Gonorrhea: NEGATIVE

## 2019-06-26 NOTE — Progress Notes (Signed)
Pt is here for ROB, [redacted]w[redacted]d.  

## 2019-06-27 ENCOUNTER — Encounter: Payer: Self-pay | Admitting: Obstetrics

## 2019-06-27 ENCOUNTER — Other Ambulatory Visit: Payer: Self-pay | Admitting: Obstetrics

## 2019-06-27 DIAGNOSIS — B373 Candidiasis of vulva and vagina: Secondary | ICD-10-CM

## 2019-06-27 DIAGNOSIS — B3731 Acute candidiasis of vulva and vagina: Secondary | ICD-10-CM

## 2019-06-27 LAB — CERVICOVAGINAL ANCILLARY ONLY
Bacterial Vaginitis (gardnerella): NEGATIVE
Candida Glabrata: NEGATIVE
Candida Vaginitis: POSITIVE — AB
Chlamydia: NEGATIVE
Comment: NEGATIVE
Comment: NEGATIVE
Comment: NEGATIVE
Comment: NEGATIVE
Comment: NEGATIVE
Comment: NORMAL
Neisseria Gonorrhea: NEGATIVE
Trichomonas: NEGATIVE

## 2019-06-27 MED ORDER — FLUCONAZOLE 150 MG PO TABS: 150.0000 mg | ORAL_TABLET | Freq: Once | ORAL | 0 refills | Status: AC

## 2019-06-27 NOTE — Progress Notes (Signed)
Subjective:  Jennifer Ellison is a 34 y.o. H6P5916 at [redacted]w[redacted]d being seen today for ongoing prenatal care.  She is currently monitored for the following issues for this high-risk pregnancy and has Shortness of breath; Class 3 severe obesity due to excess calories without serious comorbidity with body mass index (BMI) of 45.0 to 49.9 in adult [facility]); Anxiety; Supervision of high-risk pregnancy; Group B streptococcal bacteriuria; GDM (gestational diabetes mellitus); and Obesity complicating pregnancy on their problem list.  Patient reports no complaints.  Contractions: Irregular. Vag. Bleeding: None.  Movement: Present. Denies leaking of fluid.   The following portions of the patient's history were reviewed and updated as appropriate: allergies, current medications, past family history, past medical history, past social history, past surgical history and problem list. Problem list updated.  Objective:   Vitals:   06/26/19 1543  BP: 133/85  Pulse: (!) 117  Weight: (!) 300 lb 14.4 oz (136.5 kg)    Fetal Status:     Movement: Present     General:  Alert, oriented and cooperative. Patient is in no acute distress.  Skin: Skin is warm and dry. No rash noted.   Cardiovascular: Normal heart rate noted  Respiratory: Normal respiratory effort, no problems with respiration noted  Abdomen: Soft, gravid, appropriate for gestational age. Pain/Pressure: Absent     Pelvic:  Cervical exam deferred        Extremities: Normal range of motion.  Edema: Trace  Mental Status: Normal mood and affect. Normal behavior. Normal judgment and thought content.   Urinalysis:      Assessment and Plan:  Pregnancy: B8G6659 at [redacted]w[redacted]d  1. Supervision of high risk pregnancy, antepartum Rx: - Strep Gp B NAA - Cervicovaginal ancillary only( )  2. Diet controlled gestational diabetes mellitus (GDM) in third trimester - glucose well controlled  3. Obesity affecting pregnancy in third trimester   Term labor  symptoms and general obstetric precautions including but not limited to vaginal bleeding, contractions, leaking of fluid and fetal movement were reviewed in detail with the patient. Please refer to After Visit Summary for other counseling recommendations.  Return in about 1 week (around 07/03/2019) for [facility] HOB-Faculty Only.   Brock Bad, MD  06/27/2019

## 2019-06-28 LAB — STREP GP B NAA: Strep Gp B NAA: POSITIVE — AB

## 2019-07-02 ENCOUNTER — Encounter (HOSPITAL_COMMUNITY): Payer: Self-pay

## 2019-07-02 ENCOUNTER — Other Ambulatory Visit: Payer: Self-pay

## 2019-07-02 ENCOUNTER — Ambulatory Visit (HOSPITAL_COMMUNITY)
Admission: RE | Admit: 2019-07-02 | Discharge: 2019-07-02 | Disposition: A | Discharge status: Home / Self Care | Payer: Medicaid Other | Source: Ambulatory Visit | Attending: Obstetrics and Gynecology | Admitting: Obstetrics and Gynecology

## 2019-07-02 ENCOUNTER — Ambulatory Visit (HOSPITAL_COMMUNITY): Payer: Medicaid Other | Admitting: *Deleted

## 2019-07-02 DIAGNOSIS — R8271 Bacteriuria: Secondary | ICD-10-CM | POA: Diagnosis present

## 2019-07-02 DIAGNOSIS — O24410 Gestational diabetes mellitus in pregnancy, diet controlled: Secondary | ICD-10-CM | POA: Diagnosis not present

## 2019-07-02 DIAGNOSIS — Z3A37 37 weeks gestation of pregnancy: Secondary | ICD-10-CM

## 2019-07-02 DIAGNOSIS — O99213 Obesity complicating pregnancy, third trimester: Secondary | ICD-10-CM | POA: Diagnosis not present

## 2019-07-02 DIAGNOSIS — Z362 Encounter for other antenatal screening follow-up: Secondary | ICD-10-CM | POA: Diagnosis not present

## 2019-07-03 ENCOUNTER — Encounter: Payer: Self-pay | Admitting: Obstetrics

## 2019-07-03 ENCOUNTER — Telehealth (HOSPITAL_COMMUNITY): Payer: Self-pay | Admitting: *Deleted

## 2019-07-03 ENCOUNTER — Encounter (HOSPITAL_COMMUNITY): Payer: Self-pay | Admitting: *Deleted

## 2019-07-03 ENCOUNTER — Telehealth (INDEPENDENT_AMBULATORY_CARE_PROVIDER_SITE_OTHER): Payer: Medicaid Other | Admitting: Obstetrics

## 2019-07-03 DIAGNOSIS — Z3A37 37 weeks gestation of pregnancy: Secondary | ICD-10-CM

## 2019-07-03 DIAGNOSIS — O24410 Gestational diabetes mellitus in pregnancy, diet controlled: Secondary | ICD-10-CM | POA: Diagnosis not present

## 2019-07-03 DIAGNOSIS — O0990 Supervision of high risk pregnancy, unspecified, unspecified trimester: Secondary | ICD-10-CM

## 2019-07-03 DIAGNOSIS — R8271 Bacteriuria: Secondary | ICD-10-CM

## 2019-07-03 DIAGNOSIS — O99820 Streptococcus B carrier state complicating pregnancy: Secondary | ICD-10-CM

## 2019-07-03 DIAGNOSIS — E669 Obesity, unspecified: Secondary | ICD-10-CM

## 2019-07-03 DIAGNOSIS — O99213 Obesity complicating pregnancy, third trimester: Secondary | ICD-10-CM

## 2019-07-03 NOTE — Telephone Encounter (Signed)
Preadmission screen  

## 2019-07-03 NOTE — Progress Notes (Signed)
Please review recent u/s for delivery recommendation.

## 2019-07-03 NOTE — Progress Notes (Signed)
OBSTETRICS PRENATAL VIRTUAL VISIT ENCOUNTER NOTE  Provider location: [facility]   I connected with Jennifer Ellison on 07/03/19 at  1:15 PM EDT by MyChart Video Encounter at home and verified that I am speaking with the correct person using two identifiers.   I discussed the limitations, risks, security and privacy concerns of performing an evaluation and management service virtually and the availability of in person appointments. I also discussed with the patient that there may be a patient responsible charge related to this service. The patient expressed understanding and agreed to proceed. Subjective:  Jennifer Ellison is a 34 y.o. W7P7106 at [redacted]w[redacted]d being seen today for ongoing prenatal care.  She is currently monitored for the following issues for this high-risk pregnancy and has Shortness of breath; Class 3 severe obesity due to excess calories without serious comorbidity with body mass index (BMI) of 45.0 to 49.9 in adult [facility]); Anxiety; Supervision of high-risk pregnancy; Group B streptococcal bacteriuria; GDM (gestational diabetes mellitus); and Obesity complicating pregnancy on their problem list.  Patient reports no complaints.  Contractions: Irregular.  .  Movement: Present. Denies any leaking of fluid.   The following portions of the patient's history were reviewed and updated as appropriate: allergies, current medications, past family history, past medical history, past social history, past surgical history and problem list.   Objective:   Vitals:   07/03/19 1316  BP: 110/60    Fetal Status:     Movement: Present     General:  Alert, oriented and cooperative. Patient is in no acute distress.  Respiratory: Normal respiratory effort, no problems with respiration noted  Mental Status: Normal mood and affect. Normal behavior. Normal judgment and thought content.  Rest of physical exam deferred due to type of encounter  Imaging: [place] MFM FETAL BPP WO  NON STRESS  Result Date: 06/04/2019 ----------------------------------------------------------------------  OBSTETRICS REPORT                       (Signed Final 06/04/2019 04:29 pm) ---------------------------------------------------------------------- Patient Info  ID #:       [id number]                          D.O.B.:  09-May-1985 (34 yrs)  Name:       Jennifer Ellison                Visit Date: 06/04/2019 01:29 pm ---------------------------------------------------------------------- Performed By  Performed By:     Percell Boston          Ref. Address:     [address]                                                             [address] [phone]  [place]                                                             [address]  Attending:        Noralee Space MD        [facility]  Referred By:      [facility] ---------------------------------------------------------------------- Orders   #  Description                          Code         Ordered By   1  [place] MFM OB FOLLOW UP                  76816.01     YU FANG   2  [place] MFM FETAL BPP WO NON              76819.01     YU FANG      STRESS  ----------------------------------------------------------------------   #  Order #                    Accession #                 Episode #   1  [id number]                  [id number]                  [id number]   2  [id number]                  [id number]                  [id number]  ---------------------------------------------------------------------- Indications   [redacted] weeks gestation of pregnancy                Z3A.33   Maternal morbid obesity (Pre Pregnancy BMI     O99.210 E66.01   45.7)   Encounter for other antenatal screening        Z36.2   follow-up   Gestational diabetes in pregnancy, diet         O24.410   controlled  ---------------------------------------------------------------------- Fetal Evaluation  Num Of Fetuses:         1  Fetal Heart Rate(bpm):  138  Cardiac Activity:       Observed  Presentation:           Cephalic  Placenta:               Anterior  P. Cord Insertion:      Previously Visualized  Amniotic Fluid  AFI FV:      Within normal limits  AFI Sum(cm)     %Tile       Largest Pocket(cm)  12.35  36          4.36  RUQ(cm)       RLQ(cm)       LUQ(cm)        LLQ(cm)  4.17          0             4.36           3.82 ---------------------------------------------------------------------- Biophysical Evaluation  Amniotic F.V:   Within normal limits       F. Tone:        Observed  F. Movement:    Observed                   Score:          8/8  F. Breathing:   Observed ---------------------------------------------------------------------- Biometry  BPD:      86.6  mm     G. Age:  35w 0d         80  %    CI:        78.48   %    70 - 86                                                          FL/HC:      20.3   %    19.4 - 21.8  HC:      309.2  mm     G. Age:  34w 4d         33  %    HC/AC:      0.99        0.96 - 1.11  AC:      312.8  mm     G. Age:  35w 2d         89  %    FL/BPD:     72.6   %    71 - 87  FL:       62.9  mm     G. Age:  32w 4d         13  %    FL/AC:      20.1   %    20 - 24  Est. FW:    2421  gm      5 lb 5 oz     64  % ---------------------------------------------------------------------- OB History  Gravidity:    5         Term:   2         SAB:   2  Living:       2 ---------------------------------------------------------------------- Gestational Age  LMP:           33w 5d        Date:  10/11/18                 EDD:   07/18/19  U/S Today:     34w 3d                                        EDD:   07/13/19  Best:          33w 5d     Det. By:  LMP  (10/11/18)          EDD:   07/18/19 ---------------------------------------------------------------------- Anatomy  Cranium:                Appears normal         Aortic Arch:            Previously seen  Cavum:                 Previously seen        Ductal Arch:            Previously seen  Ventricles:            Previously seen        Diaphragm:              Previously seen  Choroid Plexus:        Previously seen        Stomach:                Appears normal, left                                                                        sided  Cerebellum:            Previously seen        Abdomen:                Previously seen  Posterior Fossa:       Previously seen        Abdominal Wall:         Previously seen  Nuchal Fold:           Previously seen        Cord Vessels:           Previously seen  Face:                  Orbits and profile     Kidneys:                Previously seen                         previously seen  Lips:                  Previously seen        Bladder:                Appears normal  Thoracic:              Previously seen        Spine:                  Limited views                                                                        previously seen  Heart:  Previously seen        Upper Extremities:      Previously seen  RVOT:                  Previously seen        Lower Extremities:      Previously seen  LVOT:                  Previously seen  Other:  Fetus appeared to be female previously. Heels visualized previously.          Nasal bone visualized previously. Technically difficult due to maternal          habitus and fetal position. ---------------------------------------------------------------------- Cervix Uterus Adnexa  Cervix  Not visualized (advanced GA >24wks) ---------------------------------------------------------------------- Impression  Gestational diabetes. Well-controlled on diet.  Amniotic fluid is normal and good fetal activity is seen. Fetal  growth is appropriate for gestational age. Antenatal testing is  reassuring. BPP 8/8.  ---------------------------------------------------------------------- Recommendations  -An appointment was made for her to return in 4 weeks for  completion of fetal anatomy. ----------------------------------------------------------------------                  Noralee Space, MD Electronically Signed Final Report   06/04/2019 04:29 pm ----------------------------------------------------------------------  [place] MFM OB FOLLOW UP  Result Date: 07/02/2019 ----------------------------------------------------------------------  OBSTETRICS REPORT                       (Signed Final 07/02/2019 01:35 pm) ---------------------------------------------------------------------- Patient Info  ID #:       [id number]                          D.O.B.:  Sep 28, 1985 (33 yrs)  Name:       Jennifer Ellison                Visit Date: 07/02/2019 12:41 pm ---------------------------------------------------------------------- Performed By  Performed By:     Ramond Craver Tester BS,       Ref. Address:     [address]                                                             [address]                                                             [address]                                                             [place]  [address]  Attending:        Ma Rings MD         [facility]  Referred By:      [facility] ---------------------------------------------------------------------- Orders   #  Description                          Code         Ordered By   1  [place] MFM OB FOLLOW UP                  [phone]     [facility]  ----------------------------------------------------------------------   #  Order #                    Accession #                 Episode #   1  [id number]                  [id number]                  [id number]   ---------------------------------------------------------------------- Indications   Gestational diabetes in pregnancy, diet        O24.410   controlled   [redacted] weeks gestation of pregnancy                Z3A.37   Maternal morbid obesity (Pre Pregnancy BMI     O99.210 E66.01   45.7)   Encounter for other antenatal screening        Z36.2   follow-up  ---------------------------------------------------------------------- Fetal Evaluation  Num Of Fetuses:         1  Fetal Heart Rate(bpm):  135  Cardiac Activity:       Observed  Presentation:           Cephalic  Placenta:               Anterior  P. Cord Insertion:      Previously Visualized  Amniotic Fluid  AFI FV:      Within normal limits  AFI Sum(cm)     %Tile       Largest Pocket(cm)  13.82           52          5.81  RUQ(cm)       RLQ(cm)       LUQ(cm)        LLQ(cm)  0             3.74          5.81           4.27 ---------------------------------------------------------------------- Biometry  BPD:      93.2  mm     G. Age:  37w 6d         76  %    CI:        81.62   %    70 - 86  FL/HC:      21.0   %    20.9 - 22.7  HC:      325.6  mm     G. Age:  36w 6d         13  %    HC/AC:      0.96        0.92 - 1.05  AC:      338.7  mm     G. Age:  37w 5d         69  %    FL/BPD:     73.4   %    71 - 87  FL:       68.4  mm     G. Age:  35w 1d          4  %    FL/AC:      20.2   %    20 - 24  LV:        5.7  mm  Est. FW:    3108  gm    6 lb 14 oz      43  % ---------------------------------------------------------------------- OB History  Gravidity:    5         Term:   2         SAB:   2  Living:       2 ---------------------------------------------------------------------- Gestational Age  LMP:           37w 5d        Date:  10/11/18                 EDD:   07/18/19  U/S Today:     36w 6d                                        EDD:   07/24/19  Best:          37w 5d     Det. By:  LMP  (10/11/18)          EDD:   07/18/19  ---------------------------------------------------------------------- Anatomy  Cranium:               Previously seen        Aortic Arch:            Previously seen  Cavum:                 Previously seen        Ductal Arch:            Previously seen  Ventricles:            Appears normal         Diaphragm:              Appears normal  Choroid Plexus:        Previously seen        Stomach:                Appears normal, left  sided  Cerebellum:            Previously seen        Abdomen:                Previously seen  Posterior Fossa:       Previously seen        Abdominal Wall:         Previously seen  Nuchal Fold:           Previously seen        Cord Vessels:           Previously seen  Face:                  Orbits and profile     Kidneys:                Appear normal                         previously seen  Lips:                  Previously seen        Bladder:                Appears normal  Thoracic:              Previously seen        Spine:                  Previously seen  Heart:                 Previously seen        Upper Extremities:      Previously seen  RVOT:                  Previously seen        Lower Extremities:      Previously seen  LVOT:                  Previously seen  Other:  Fetus appeared to be female previously. Heels visualized previously.          Nasal bone visualized previously. Technically difficult due to maternal          habitus and fetal position. ---------------------------------------------------------------------- Cervix Uterus Adnexa  Cervix  Not visualized (advanced GA >24wks)  Uterus  No abnormality visualized.  Left Ovary  Not visualized. No adnexal mass visualized.  Right Ovary  Within normal limits. No adnexal mass visualized.  Cul De Sac  No free fluid seen.  Adnexa  No abnormality visualized. ---------------------------------------------------------------------- Comments  This patient was seen for a  follow up growth scan due to diet-  controlled gestational diabetes.  She denies any problems  since her last exam and reports that her fingerstick values  have been mostly within normal limits.  She was informed that the fetal growth and amniotic fluid  level appears appropriate for her gestational age.  Due to gestational diabetes, delivery is recommended at  around 39 weeks.  No further exams were scheduled in our office. ----------------------------------------------------------------------                   Ma Rings, MD Electronically Signed Final Report   07/02/2019 01:35 pm ----------------------------------------------------------------------  [place] MFM OB FOLLOW UP  Result Date: 06/04/2019 ----------------------------------------------------------------------  OBSTETRICS REPORT                       (  Signed Final 06/04/2019 04:29 pm) ---------------------------------------------------------------------- Patient Info  ID #:       [id number]                          D.O.B.:  09-12-1985 (33 yrs)  Name:       Jennifer Ellison                Visit Date: 06/04/2019 01:29 pm ---------------------------------------------------------------------- Performed By  Performed By:     Percell Boston          Ref. Address:     [address]                                                             [address]                                                             [place]                                                             [address]  Attending:        Noralee Space MD        [facility]  Referred By:      [facility] ---------------------------------------------------------------------- Orders   #  Description                          Code         Ordered By   1  [place] MFM OB FOLLOW UP                  76816.01     YU FANG   2  [place]  MFM FETAL BPP WO NON              34742.59     YU FANG      STRESS  ----------------------------------------------------------------------   #  Order #  Accession #                 Episode #   1  [id number]                  [id number]                  [id number]   2  [id number]                  [id number]                  [id number]  ---------------------------------------------------------------------- Indications   [redacted] weeks gestation of pregnancy                Z3A.33   Maternal morbid obesity (Pre Pregnancy BMI     O99.210 E66.01   45.7)   Encounter for other antenatal screening        Z36.2   follow-up   Gestational diabetes in pregnancy, diet        O24.410   controlled  ---------------------------------------------------------------------- Fetal Evaluation  Num Of Fetuses:         1  Fetal Heart Rate(bpm):  138  Cardiac Activity:       Observed  Presentation:           Cephalic  Placenta:               Anterior  P. Cord Insertion:      Previously Visualized  Amniotic Fluid  AFI FV:      Within normal limits  AFI Sum(cm)     %Tile       Largest Pocket(cm)  12.35           36          4.36  RUQ(cm)       RLQ(cm)       LUQ(cm)        LLQ(cm)  4.17          0             4.36           3.82 ---------------------------------------------------------------------- Biophysical Evaluation  Amniotic F.V:   Within normal limits       F. Tone:        Observed  F. Movement:    Observed                   Score:          8/8  F. Breathing:   Observed ---------------------------------------------------------------------- Biometry  BPD:      86.6  mm     G. Age:  35w 0d         80  %    CI:        78.48   %    70 - 86                                                          FL/HC:      20.3   %    19.4 - 21.8  HC:      309.2  mm     G. Age:  34w 4d         33  %    HC/AC:  0.99        0.96 - 1.11  AC:      312.8  mm     G. Age:  35w 2d         89  %    FL/BPD:     72.6   %    71 - 87  FL:       62.9  mm     G.  Age:  32w 4d         13  %    FL/AC:      20.1   %    20 - 24  Est. FW:    2421  gm      5 lb 5 oz     64  % ---------------------------------------------------------------------- OB History  Gravidity:    5         Term:   2         SAB:   2  Living:       2 ---------------------------------------------------------------------- Gestational Age  LMP:           33w 5d        Date:  10/11/18                 EDD:   07/18/19  U/S Today:     34w 3d                                        EDD:   07/13/19  Best:          33w 5d     Det. By:  LMP  (10/11/18)          EDD:   07/18/19 ---------------------------------------------------------------------- Anatomy  Cranium:               Appears normal         Aortic Arch:            Previously seen  Cavum:                 Previously seen        Ductal Arch:            Previously seen  Ventricles:            Previously seen        Diaphragm:              Previously seen  Choroid Plexus:        Previously seen        Stomach:                Appears normal, left                                                                        sided  Cerebellum:            Previously seen        Abdomen:                Previously seen  Posterior Fossa:       Previously seen  Abdominal Wall:         Previously seen  Nuchal Fold:           Previously seen        Cord Vessels:           Previously seen  Face:                  Orbits and profile     Kidneys:                Previously seen                         previously seen  Lips:                  Previously seen        Bladder:                Appears normal  Thoracic:              Previously seen        Spine:                  Limited views                                                                        previously seen  Heart:                 Previously seen        Upper Extremities:      Previously seen  RVOT:                  Previously seen        Lower Extremities:      Previously seen  LVOT:                  Previously seen   Other:  Fetus appeared to be female previously. Heels visualized previously.          Nasal bone visualized previously. Technically difficult due to maternal          habitus and fetal position. ---------------------------------------------------------------------- Cervix Uterus Adnexa  Cervix  Not visualized (advanced GA >24wks) ---------------------------------------------------------------------- Impression  Gestational diabetes. Well-controlled on diet.  Amniotic fluid is normal and good fetal activity is seen. Fetal  growth is appropriate for gestational age. Antenatal testing is  reassuring. BPP 8/8. ---------------------------------------------------------------------- Recommendations  -An appointment was made for her to return in 4 weeks for  completion of fetal anatomy. ----------------------------------------------------------------------                  Noralee Space, MD Electronically Signed Final Report   06/04/2019 04:29 pm ----------------------------------------------------------------------   Assessment and Plan:  Pregnancy: [id number] at [redacted]w[redacted]d 1. Diet controlled gestational diabetes mellitus (GDM) in third trimester - glucose well controlled:  FBS's , 90 /  2 hr PP < 120 - delivery at 39 weeks, per Dr. Parke Poisson  2. Obesity affecting pregnancy in third trimester  3. Supervision of high risk pregnancy, antepartum  4. Group B streptococcal bacteriuria - treat in labor  Term labor symptoms and general obstetric precautions including but not limited to vaginal bleeding,  contractions, leaking of fluid and fetal movement were reviewed in detail with the patient. I discussed the assessment and treatment plan with the patient. The patient was provided an opportunity to ask questions and all were answered. The patient agreed with the plan and demonstrated an understanding of the instructions. The patient was advised to call back or seek an in-person office evaluation/go to [facility] at [facility] for any urgent or concerning symptoms. Please refer to After Visit Summary for other counseling recommendations.   I provided 10 minutes of face-to-face time during this encounter.  No follow-ups on file.  No future appointments.  Coral Ceo, MD [facility] 07/03/2019

## 2019-07-04 ENCOUNTER — Other Ambulatory Visit: Payer: Self-pay | Admitting: Advanced Practice Midwife

## 2019-07-06 ENCOUNTER — Other Ambulatory Visit: Payer: Self-pay | Admitting: Advanced Practice Midwife

## 2019-07-07 ENCOUNTER — Other Ambulatory Visit: Payer: Self-pay | Admitting: Advanced Practice Midwife

## 2019-07-09 ENCOUNTER — Other Ambulatory Visit (HOSPITAL_COMMUNITY)
Admission: RE | Admit: 2019-07-09 | Discharge: 2019-07-09 | Disposition: A | Discharge status: Home / Self Care | Payer: Medicaid Other | Source: Ambulatory Visit | Attending: Family Medicine | Admitting: Family Medicine

## 2019-07-09 DIAGNOSIS — Z20822 Contact with and (suspected) exposure to covid-19: Secondary | ICD-10-CM | POA: Insufficient documentation

## 2019-07-09 DIAGNOSIS — Z01812 Encounter for preprocedural laboratory examination: Secondary | ICD-10-CM | POA: Insufficient documentation

## 2019-07-09 LAB — SARS CORONAVIRUS 2 (TAT 6-24 HRS): SARS Coronavirus 2: NEGATIVE

## 2019-07-10 ENCOUNTER — Telehealth (INDEPENDENT_AMBULATORY_CARE_PROVIDER_SITE_OTHER): Payer: Medicaid Other | Admitting: Obstetrics & Gynecology

## 2019-07-10 ENCOUNTER — Other Ambulatory Visit: Payer: Self-pay | Admitting: Advanced Practice Midwife

## 2019-07-10 DIAGNOSIS — O0990 Supervision of high risk pregnancy, unspecified, unspecified trimester: Secondary | ICD-10-CM

## 2019-07-10 DIAGNOSIS — O24410 Gestational diabetes mellitus in pregnancy, diet controlled: Secondary | ICD-10-CM | POA: Diagnosis not present

## 2019-07-10 DIAGNOSIS — O99213 Obesity complicating pregnancy, third trimester: Secondary | ICD-10-CM

## 2019-07-10 DIAGNOSIS — Z3A38 38 weeks gestation of pregnancy: Secondary | ICD-10-CM

## 2019-07-10 DIAGNOSIS — O99820 Streptococcus B carrier state complicating pregnancy: Secondary | ICD-10-CM

## 2019-07-10 DIAGNOSIS — R8271 Bacteriuria: Secondary | ICD-10-CM

## 2019-07-10 NOTE — Progress Notes (Signed)
   TELEHEALTH VIRTUAL OBSTETRICS VISIT ENCOUNTER NOTE  I connected with Jennifer Ellison on 07/10/19 at 11:00 AM EDT by telephone at home and verified that I am speaking with the correct person using two identifiers.   I discussed the limitations, risks, security and privacy concerns of performing an evaluation and management service by telephone and the availability of in person appointments. I also discussed with the patient that there may be a patient responsible charge related to this service. The patient expressed understanding and agreed to proceed.  Subjective:  Jennifer Ellison is a 34 y.o. W7P7106 at [redacted]w[redacted]d being followed for ongoing prenatal care.  She is currently monitored for the following issues for this high-risk pregnancy and has Shortness of breath; Class 3 severe obesity due to excess calories without serious comorbidity with body mass index (BMI) of 45.0 to 49.9 in adult [facility]); Anxiety; Supervision of high-risk pregnancy; Group B streptococcal bacteriuria; GDM (gestational diabetes mellitus); and Obesity complicating pregnancy on their problem list.  Patient reports hand and feet numbness and swelling. BG are normal. Reports fetal movement. Denies any contractions, bleeding or leaking of fluid.   The following portions of the patient's history were reviewed and updated as appropriate: allergies, current medications, past family history, past medical history, past social history, past surgical history and problem list.   Objective:   General:  Alert, oriented and cooperative.   Mental Status: Normal mood and affect perceived. Normal judgment and thought content.  Rest of physical exam deferred due to type of encounter  Assessment and Plan:  Pregnancy: [id number] at [redacted]w[redacted]d 1. Diet controlled gestational diabetes mellitus (GDM) in third trimester Good control with diet  2. Obesity affecting pregnancy in third trimester There is no height or weight on file to calculate BMI.    3. Supervision of high risk pregnancy, antepartum IOL 39 weeks  4. Group B streptococcal bacteriuria Abx during labor  Term labor symptoms and general obstetric precautions including but not limited to vaginal bleeding, contractions, leaking of fluid and fetal movement were reviewed in detail with the patient.  I discussed the assessment and treatment plan with the patient. The patient was provided an opportunity to ask questions and all were answered. The patient agreed with the plan and demonstrated an understanding of the instructions. The patient was advised to call back or seek an in-person office evaluation/go to [facility] at [facility] for any urgent or concerning symptoms. Please refer to After Visit Summary for other counseling recommendations.   I provided 12 minutes of non-face-to-face time during this encounter.  Return in about 4 weeks (around 08/07/2019) for postpartum.  Future Appointments  Date Time Provider Department Center  07/11/2019  9:00 AM [facility] SCHED ROOM [facility] None    Scheryl Darter, MD Center for [facility], [facility]

## 2019-07-10 NOTE — Patient Instructions (Signed)

## 2019-07-10 NOTE — Progress Notes (Signed)
Pt states that her blood sugars have doing well.

## 2019-07-11 ENCOUNTER — Encounter (HOSPITAL_COMMUNITY): Payer: Self-pay | Admitting: Obstetrics and Gynecology

## 2019-07-11 ENCOUNTER — Other Ambulatory Visit: Payer: Self-pay

## 2019-07-11 ENCOUNTER — Inpatient Hospital Stay (HOSPITAL_COMMUNITY): Payer: Medicaid Other | Admitting: Anesthesiology

## 2019-07-11 ENCOUNTER — Inpatient Hospital Stay (HOSPITAL_COMMUNITY): Payer: Medicaid Other

## 2019-07-11 ENCOUNTER — Inpatient Hospital Stay (HOSPITAL_COMMUNITY)
Admission: AD | Admit: 2019-07-11 | Discharge: 2019-07-13 | DRG: 807 | Disposition: A | Discharge status: Home / Self Care | Payer: Medicaid Other | Attending: Obstetrics & Gynecology | Admitting: Obstetrics & Gynecology

## 2019-07-11 DIAGNOSIS — O24419 Gestational diabetes mellitus in pregnancy, unspecified control: Secondary | ICD-10-CM

## 2019-07-11 DIAGNOSIS — O99824 Streptococcus B carrier state complicating childbirth: Secondary | ICD-10-CM | POA: Diagnosis present

## 2019-07-11 DIAGNOSIS — Z30430 Encounter for insertion of intrauterine contraceptive device: Secondary | ICD-10-CM

## 2019-07-11 DIAGNOSIS — O99214 Obesity complicating childbirth: Secondary | ICD-10-CM | POA: Diagnosis present

## 2019-07-11 DIAGNOSIS — Z3A39 39 weeks gestation of pregnancy: Secondary | ICD-10-CM

## 2019-07-11 DIAGNOSIS — R8271 Bacteriuria: Secondary | ICD-10-CM | POA: Diagnosis present

## 2019-07-11 DIAGNOSIS — Z20822 Contact with and (suspected) exposure to covid-19: Secondary | ICD-10-CM | POA: Diagnosis present

## 2019-07-11 DIAGNOSIS — Z6841 Body Mass Index (BMI) 40.0 and over, adult: Secondary | ICD-10-CM

## 2019-07-11 DIAGNOSIS — O24420 Gestational diabetes mellitus in childbirth, diet controlled: Secondary | ICD-10-CM | POA: Diagnosis present

## 2019-07-11 DIAGNOSIS — Z8632 Personal history of gestational diabetes: Secondary | ICD-10-CM | POA: Diagnosis present

## 2019-07-11 DIAGNOSIS — O24429 Gestational diabetes mellitus in childbirth, unspecified control: Secondary | ICD-10-CM | POA: Diagnosis not present

## 2019-07-11 DIAGNOSIS — O0990 Supervision of high risk pregnancy, unspecified, unspecified trimester: Secondary | ICD-10-CM

## 2019-07-11 LAB — TYPE AND SCREEN
ABO/RH(D): O POS
Antibody Screen: NEGATIVE

## 2019-07-11 LAB — GLUCOSE, CAPILLARY
Glucose-Capillary: 161 mg/dL — ABNORMAL HIGH (ref 70–99)
Glucose-Capillary: 88 mg/dL (ref 70–99)
Glucose-Capillary: 76 mg/dL (ref 70–99)
Glucose-Capillary: 77 mg/dL (ref 70–99)
Glucose-Capillary: 80 mg/dL (ref 70–99)
Glucose-Capillary: 60 mg/dL — ABNORMAL LOW (ref 70–99)

## 2019-07-11 LAB — CBC
HCT: 42.2 % (ref 36.0–46.0)
Hemoglobin: 13.6 g/dL (ref 12.0–15.0)
MCH: 29.2 pg (ref 26.0–34.0)
MCHC: 32.2 g/dL (ref 30.0–36.0)
MCV: 90.6 fL (ref 80.0–100.0)
Platelets: 301 10*3/uL (ref 150–400)
RBC: 4.66 MIL/uL (ref 3.87–5.11)
RDW: 15.1 % (ref 11.5–15.5)
WBC: 13.1 10*3/uL — ABNORMAL HIGH (ref 4.0–10.5)
nRBC: 0 % (ref 0.0–0.2)

## 2019-07-11 LAB — RPR: RPR Ser Ql: NONREACTIVE

## 2019-07-11 LAB — ABO/RH: ABO/RH(D): O POS

## 2019-07-11 MED ORDER — PHENYLEPHRINE 40 MCG/ML (10ML) SYRINGE FOR IV PUSH (FOR BLOOD PRESSURE SUPPORT)
80.0000 ug | PREFILLED_SYRINGE | INTRAVENOUS | Status: DC | PRN
  Administered 2019-07-11 (×2): 80 ug via INTRAVENOUS

## 2019-07-11 MED ORDER — DIPHENHYDRAMINE HCL 50 MG/ML IJ SOLN: 12.5000 mg | INTRAMUSCULAR | Status: DC | PRN

## 2019-07-11 MED ORDER — ONDANSETRON HCL 4 MG/2ML IJ SOLN
4.0000 mg | Freq: Four times a day (QID) | INTRAMUSCULAR | Status: DC | PRN
  Administered 2019-07-12: 4 mg via INTRAVENOUS
  Filled 2019-07-11: qty 2

## 2019-07-11 MED ORDER — EPHEDRINE 5 MG/ML INJ: 10.0000 mg | INTRAVENOUS | Status: DC | PRN

## 2019-07-11 MED ORDER — LIDOCAINE HCL (PF) 1 % IJ SOLN: 30.0000 mL | INTRAMUSCULAR | Status: DC | PRN

## 2019-07-11 MED ORDER — DEXAMETHASONE SODIUM PHOSPHATE 10 MG/ML IJ SOLN
INTRAMUSCULAR | Status: AC
  Filled 2019-07-11: qty 1

## 2019-07-11 MED ORDER — OXYTOCIN 40 UNITS IN NORMAL SALINE INFUSION - SIMPLE MED
2.5000 [IU]/h | INTRAVENOUS | Status: DC
  Administered 2019-07-12: 2.5 [IU]/h via INTRAVENOUS
  Filled 2019-07-11: qty 1000

## 2019-07-11 MED ORDER — PHENYLEPHRINE 40 MCG/ML (10ML) SYRINGE FOR IV PUSH (FOR BLOOD PRESSURE SUPPORT)
80.0000 ug | PREFILLED_SYRINGE | INTRAVENOUS | Status: DC | PRN
  Filled 2019-07-11: qty 10

## 2019-07-11 MED ORDER — LACTATED RINGERS IV SOLN: 500.0000 mL | Freq: Once | INTRAVENOUS | Status: DC

## 2019-07-11 MED ORDER — OXYCODONE-ACETAMINOPHEN 5-325 MG PO TABS: 2.0000 | ORAL_TABLET | ORAL | Status: DC | PRN

## 2019-07-11 MED ORDER — OXYTOCIN 40 UNITS IN NORMAL SALINE INFUSION - SIMPLE MED
1.0000 m[IU]/min | INTRAVENOUS | Status: DC
  Administered 2019-07-11 (×2): 2 m[IU]/min via INTRAVENOUS

## 2019-07-11 MED ORDER — SOD CITRATE-CITRIC ACID 500-334 MG/5ML PO SOLN: 30.0000 mL | ORAL | Status: DC | PRN

## 2019-07-11 MED ORDER — SODIUM CHLORIDE (PF) 0.9 % IJ SOLN
INTRAMUSCULAR | Status: DC | PRN
  Administered 2019-07-11: 11 mL/h via EPIDURAL

## 2019-07-11 MED ORDER — TERBUTALINE SULFATE 1 MG/ML IJ SOLN
0.2500 mg | Freq: Once | INTRAMUSCULAR | Status: AC | PRN
  Administered 2019-07-11: 0.25 mg via SUBCUTANEOUS
  Filled 2019-07-11: qty 1

## 2019-07-11 MED ORDER — MISOPROSTOL 50MCG HALF TABLET
50.0000 ug | ORAL_TABLET | ORAL | Status: DC | PRN
  Administered 2019-07-11: 50 ug via BUCCAL
  Filled 2019-07-11: qty 1

## 2019-07-11 MED ORDER — SODIUM CHLORIDE 0.9 % IV SOLN
5.0000 10*6.[IU] | Freq: Once | INTRAVENOUS | Status: AC
  Administered 2019-07-11: 5 10*6.[IU] via INTRAVENOUS
  Filled 2019-07-11: qty 5

## 2019-07-11 MED ORDER — LACTATED RINGERS IV SOLN: INTRAVENOUS | Status: DC

## 2019-07-11 MED ORDER — MISOPROSTOL 25 MCG QUARTER TABLET
25.0000 ug | ORAL_TABLET | ORAL | Status: DC | PRN
  Filled 2019-07-11: qty 1

## 2019-07-11 MED ORDER — ACETAMINOPHEN 325 MG PO TABS: 650.0000 mg | ORAL_TABLET | ORAL | Status: DC | PRN

## 2019-07-11 MED ORDER — LIDOCAINE HCL (PF) 1 % IJ SOLN
INTRAMUSCULAR | Status: DC | PRN
  Administered 2019-07-11 (×2): 4 mL via EPIDURAL

## 2019-07-11 MED ORDER — LACTATED RINGERS IV SOLN: 500.0000 mL | INTRAVENOUS | Status: DC | PRN

## 2019-07-11 MED ORDER — OXYTOCIN BOLUS FROM INFUSION
500.0000 mL | Freq: Once | INTRAVENOUS | Status: DC
  Administered 2019-07-11: 500 mL via INTRAVENOUS

## 2019-07-11 MED ORDER — FENTANYL CITRATE (PF) 100 MCG/2ML IJ SOLN
100.0000 ug | INTRAMUSCULAR | Status: DC | PRN
  Filled 2019-07-11: qty 2

## 2019-07-11 MED ORDER — OXYCODONE-ACETAMINOPHEN 5-325 MG PO TABS: 1.0000 | ORAL_TABLET | ORAL | Status: DC | PRN

## 2019-07-11 MED ORDER — FENTANYL CITRATE (PF) 100 MCG/2ML IJ SOLN
INTRAMUSCULAR | Status: DC | PRN
  Administered 2019-07-11 (×2): 50 ug via EPIDURAL

## 2019-07-11 MED ORDER — LACTATED RINGERS AMNIOINFUSION
INTRAVENOUS | Status: DC
  Administered 2019-07-11: 150 mL via INTRAUTERINE

## 2019-07-11 MED ORDER — PENICILLIN G POT IN DEXTROSE 60000 UNIT/ML IV SOLN
3.0000 10*6.[IU] | INTRAVENOUS | Status: DC
  Administered 2019-07-11 (×3): 3 10*6.[IU] via INTRAVENOUS
  Filled 2019-07-11 (×3): qty 50

## 2019-07-11 MED ORDER — LEVONORGESTREL 19.5 MCG/DAY IU IUD
INTRAUTERINE_SYSTEM | Freq: Once | INTRAUTERINE | Status: AC
  Administered 2019-07-12: 1 via INTRAUTERINE
  Filled 2019-07-11: qty 1

## 2019-07-11 MED ORDER — INSULIN ASPART 100 UNIT/ML ~~LOC~~ SOLN
2.0000 [IU] | Freq: Once | SUBCUTANEOUS | Status: AC
  Administered 2019-07-11: 2 [IU] via SUBCUTANEOUS

## 2019-07-11 MED ORDER — FENTANYL-BUPIVACAINE-NACL 0.5-0.125-0.9 MG/250ML-% EP SOLN
12.0000 mL/h | EPIDURAL | Status: DC | PRN
  Filled 2019-07-11: qty 250

## 2019-07-11 MED ORDER — BUPIVACAINE HCL (PF) 0.25 % IJ SOLN
INTRAMUSCULAR | Status: DC | PRN
  Administered 2019-07-11 (×2): 4 mL via EPIDURAL

## 2019-07-11 NOTE — Progress Notes (Signed)
Went bedside for prolonged decel. Pit off and terb had been given. Cervix still 6 cm. BP low and phenylephrine given as well. HR improved. Will monitor for 20-30 minutes and plan to restart Pit if Cat I FHT.  Jerilynn Birkenhead, MD [facility] Fellow, [facility] for [employer], [facility]

## 2019-07-11 NOTE — H&P (Signed)
LABOR AND DELIVERY ADMISSION HISTORY AND PHYSICAL NOTE  Kathaleen Dudziak is a 34 y.o. female [phone] with IUP at [redacted]w[redacted]d by 6 wk [place] presenting for IOL for GDMA1.   She reports positive fetal movement. She denies leakage of fluid, vaginal bleeding, or contractions.   She plans on breast feeding. Her contraception plan is: post placental Liletta IUD.  Prenatal History/Complications: PNC at [facility]:  @[redacted]w[redacted]d , CWD, normal anatomy, cephalic presentation, anterior placenta, 43%ile, EFW 3108g  Pregnancy complications:  - GDMA1 - GBS bacteriuria - BMI 56  Past Medical History: Past Medical History:  Diagnosis Date  . Anxiety   . Blood transfusion without reported diagnosis   . Gestational diabetes   . History of postpartum hemorrhage   . History of sinusitis   . Hypoglycemia   . Obese   . Obesity   . Pregnancy 09/2018    Past Surgical History: Past Surgical History:  Procedure Laterality Date  . DILATION AND CURETTAGE OF UTERUS  2008   retained products after vaginal delivery  . LEFT OOPHORECTOMY  2012   left ovary removed; cyst related    Obstetrical History: OB History    Gravida  5   Para  2   Term  2   Preterm      AB  2   Living  2     SAB  2   TAB      Ectopic      Multiple      Live Births  2           Social History: Social History   Socioeconomic History  . Marital status: Single    Spouse name: Not on file  . Number of children: Not on file  . Years of education: Not on file  . Highest education level: Not on file  Occupational History  . Not on file  Tobacco Use  . Smoking status: Never Smoker  . Smokeless tobacco: Never Used  Substance and Sexual Activity  . Alcohol use: Not Currently  . Drug use: No  . Sexual activity: Not Currently  Other Topics Concern  . Not on file  Social History Narrative  . Not on file   Social Determinants of Health   Financial Resource Strain:   . Difficulty of Paying Living Expenses:    Food Insecurity:   . Worried About 2013 in the Last Year:   . Programme researcher, broadcasting/film/video in the Last Year:   Transportation Needs:   . Barista (Medical):   Freight forwarder Lack of Transportation (Non-Medical):   Physical Activity:   . Days of Exercise per Week:   . Minutes of Exercise per Session:   Stress:   . Feeling of Stress :   Social Connections:   . Frequency of Communication with Friends and Family:   . Frequency of Social Gatherings with Friends and Family:   . Attends Religious Services:   . Active Member of Clubs or Organizations:   . Attends [employer] Meetings:   Banker Marital Status:     Family History: Family History  Problem Relation Age of Onset  . Asthma Brother   . Diabetes Maternal Grandmother     Allergies: Allergies  Allergen Reactions  . Blueberry Flavor Swelling    Medications Prior to Admission  Medication Sig Dispense Refill Last Dose  . Prenatal Vit-Fe Fumarate-FA (PREPLUS) 27-1 MG TABS Take 1 tablet by mouth daily. 30 tablet 13 07/10/2019 at Unknown  time  . Accu-Chek Softclix Lancets lancets Use as instructed 100 each 12   . Blood Pressure Monitoring (BLOOD PRESSURE CUFF) MISC 1 Device by Does not apply route once a week. 1 each 0   . cetirizine (ZYRTEC) 10 MG tablet TAKE ONE TABLET BY MOUTH DAILY 30 tablet 10 More than a month at Unknown time  . Elastic Bandages & Supports (COMFORT FIT MATERNITY SUPP SM) MISC Wear as directed. 1 each 0   . glucose blood (ACCU-CHEK GUIDE) test strip Use as instructed 100 each 12      Review of Systems  All systems reviewed and negative except as stated in HPI  Physical Exam Blood pressure 133/86, pulse (!) 114, temperature 98.2 F (36.8 C), temperature source Oral, resp. rate 18, height 5' 2.25" (1.581 m), weight (!) 139.7 kg, last menstrual period 10/11/2018. General appearance: alert, oriented, NAD Lungs: normal respiratory effort Heart: regular rate Abdomen: soft, non-tender;  gravid Extremities: No calf swelling or tenderness Presentation: cephalic by SVE  Fetal monitoringBaseline: 125 bpm, Variability: Good {> 6 bpm), Accelerations: Reactive and Decelerations: Absent Uterine activity: None  Dilation: 1 Effacement (%): 50 Station: -2 Exam by:: Dr. Crissie Reese  Prenatal labs: ABO, Rh: --/--/O POS (04/21 2637) Antibody: NEG (04/21 0714) Rubella:   Immune RPR: Non Reactive (01/28 0905)  HBsAg:   NR HIV: Non Reactive (01/28 0905)  GC/Chlamydia: neg/neg 06/26/2019  GBS: Positive/-- (04/06 0417)  2-hr GTT: abnormal (94, 169, 134) Genetic screening:  Per patient normal Anatomy US: normal  Prenatal Transfer Tool  Maternal Diabetes: Yes:  Diabetes Type:  Diet controlled Genetic Screening: Normal Maternal Ultrasounds/Referrals: Normal Fetal Ultrasounds or other Referrals:  None Maternal Substance Abuse:  No Significant Maternal Medications:  None Significant Maternal Lab Results: Group B Strep positive  Results for orders placed or performed during the hospital encounter of 07/11/19 (from the past 24 hour(s))  CBC   Collection Time: 07/11/19  7:14 AM  Result Value Ref Range   WBC 13.1 (H) 4.0 - 10.5 K/uL   RBC 4.66 3.87 - 5.11 MIL/uL   Hemoglobin 13.6 12.0 - 15.0 g/dL   HCT 85.8 85.0 - 27.7 %   MCV 90.6 80.0 - 100.0 fL   MCH 29.2 26.0 - 34.0 pg   MCHC 32.2 30.0 - 36.0 g/dL   RDW 41.2 87.8 - 67.6 %   Platelets 301 150 - 400 K/uL   nRBC 0.0 0.0 - 0.2 %  Glucose, capillary   Collection Time: 07/11/19  7:14 AM  Result Value Ref Range   Glucose-Capillary 161 (H) 70 - 99 mg/dL  Type and screen   Collection Time: 07/11/19  7:14 AM  Result Value Ref Range   ABO/RH(D) O POS    Antibody Screen NEG    Sample Expiration      07/14/2019,2359 Performed at [facility], [address], [place], [place] [address]     Patient Active Problem List   Diagnosis Date Noted  . Gestational diabetes 07/11/2019  . GDM (gestational diabetes mellitus)  05/15/2019  . Obesity complicating pregnancy 05/15/2019  . Supervision of high-risk pregnancy 01/24/2019  . Group B streptococcal bacteriuria 01/24/2019  . Shortness of breath 12/07/2018  . Class 3 severe obesity due to excess calories without serious comorbidity with body mass index (BMI) of 45.0 to 49.9 in adult (HCC) 12/07/2018  . Anxiety 12/07/2018    Assessment: Vista Sawatzky is a 34 y.o. [id number] at [redacted]w[redacted]d here for IOL for GDMA1.  #Labor: FB placed at  0800 and miso x1 at 0815, reassess q4h. #Pain: IV pain meds PRN, epidural upon request. Plans on epidural #FWB: Cat I #GBS/ID: Positive, penicillin #COVID: swab negative 07/09/2019 #MOF: Breast #MOC: Post placental IUD, ordered and consented #Circ: n/a  #GDMA1: CBG q4h latent labor, q2h active labor. Of note CBG was 161 on arrival, patient reported having eaten pasta just prior to arrival. Given 2u aspart, watch sugars closely.   Annice Needy [facility] 07/11/2019, 8:58 AM

## 2019-07-11 NOTE — Anesthesia Procedure Notes (Signed)
Epidural Patient location during procedure: OB Start time: 07/11/2019 11:35 AM End time: 07/11/2019 11:43 AM  Staffing Anesthesiologist: Mal Amabile, MD Performed: anesthesiologist   Preanesthetic Checklist Completed: patient identified, IV checked, site marked, risks and benefits discussed, surgical consent, monitors and equipment checked, pre-op evaluation and timeout performed  Epidural Patient position: sitting Prep: DuraPrep and site prepped and draped Patient monitoring: continuous pulse ox and blood pressure Approach: midline Location: L3-L4 Injection technique: LOR air  Needle:  Needle type: Tuohy  Needle gauge: 17 G Needle length: 9 cm and 9 Needle insertion depth: 8 cm Catheter type: closed end flexible Catheter size: 19 Gauge Catheter at skin depth: 14 cm Test dose: negative and Other  Assessment Events: blood not aspirated, injection not painful, no injection resistance, no paresthesia and negative IV test  Additional Notes Patient identified. Risks and benefits discussed including failed block, incomplete  Pain control, post dural puncture headache, nerve damage, paralysis, blood pressure Changes, nausea, vomiting, reactions to medications-both toxic and allergic and post Partum back pain. All questions were answered. Patient expressed understanding and wished to proceed. Sterile technique was used throughout procedure. Epidural site was Dressed with sterile barrier dressing. No paresthesias, signs of intravascular injection Or signs of intrathecal spread were encountered.  Patient was more comfortable after the epidural was dosed. Please see RN's note for documentation of vital signs and FHR which are stable. Reason for block:procedure for pain

## 2019-07-11 NOTE — Progress Notes (Signed)
Labor Progress Note Jennifer Ellison is a 34 y.o. D0V0131 at [redacted]w[redacted]d presented for IOL for GDMA1.   S: Mom comfortable s/p epidural.  Decreased awareness of FM and CTX s/p epidural. Reports some dizziness.  Denies RUQ abdominal pain, vision changes, headache and nausea.   O:  BP 119/63   Pulse 89   Temp 99.2 F (37.3 C) (Oral)   Resp 16   Ht 5' 2.25" (1.581 m)   Wt (!) 139.7 kg   LMP 10/11/2018   SpO2 98%   BMI 55.86 kg/m  EFM: 135/fair variability / pos accels, no decels  TOCO: every 2-6 minutes   CVE: Dilation: 7 Effacement (%): 90 Station: 0, -1 Presentation: Vertex Exam by:: Dr. Rachael Darby    A&P: 34 y.o. [id number] [redacted]w[redacted]d here for IOL for GDM.  #Labor: Progressing s/p Cytotec,foley bulb.  Pt had variable decels IUPC placed and pitocin was discontinued.  Terbutaline was given and aminoinfusion started . Will restart pitocin. Reassess in 2-3 hours.  Anticipate SVD.  #Pain: epidural  #FWB: Cat 1 #GBS positive, PCN x4   #GDM:  Recent CBG 80. Continue to monitor.    [name], DO 8:48 PM

## 2019-07-11 NOTE — Progress Notes (Signed)
Labor Progress Note Jennifer Ellison is a 34 y.o. [id number] at [redacted]w[redacted]d presented for IOL for A1GDM  S:  Comfortable with epidural. No c/o.   O:  BP 104/62   Pulse 87   Temp 98.4 F (36.9 C) (Oral)   Resp 16   Ht 5' 2.25" (1.581 m)   Wt (!) 139.7 kg   LMP 10/11/2018   SpO2 98%   BMI 55.86 kg/m  EFM: baseline 140 bpm/ mod variability/ + accels/ rare variable decels  Toco/IUPC: 2-4 SVE: deferred Pitocin: 2 mu/min  A/P: 34 y.o. [id number] [redacted]w[redacted]d  1. Labor: latent 2. FWB: Cat II 3. Pain: epidural 4. GDM: stable  Continue Pitocin titration. Anticipate labor progress and SVD.  Donette Larry, CNM 1:38 PM

## 2019-07-11 NOTE — Anesthesia Preprocedure Evaluation (Signed)
Anesthesia Evaluation  Patient identified by MRN, date of birth, ID band Patient awake    Reviewed: Allergy & Precautions, Patient's Chart, lab work & pertinent test results  Airway Mallampati: III  TM Distance: >3 FB Neck ROM: Full    Dental no notable dental hx. (+) Teeth Intact   Pulmonary shortness of breath and with exertion,    Pulmonary exam normal breath sounds clear to auscultation       Cardiovascular negative cardio ROS Normal cardiovascular exam Rhythm:Regular Rate:Normal     Neuro/Psych Anxiety negative neurological ROS     GI/Hepatic Neg liver ROS, GERD  Medicated,  Endo/Other  diabetes, Well Controlled, GestationalMorbid obesity  Renal/GU negative Renal ROS  negative genitourinary   Musculoskeletal negative musculoskeletal ROS (+)   Abdominal (+) + obese,   Peds  Hematology  (+) anemia ,   Anesthesia Other Findings   Reproductive/Obstetrics (+) Pregnancy                             Anesthesia Physical Anesthesia Plan  ASA: III  Anesthesia Plan: Epidural   Post-op Pain Management:    Induction:   PONV Risk Score and Plan:   Airway Management Planned: Natural Airway  Additional Equipment:   Intra-op Plan:   Post-operative Plan:   Informed Consent: I have reviewed the patients History and Physical, chart, labs and discussed the procedure including the risks, benefits and alternatives for the proposed anesthesia with the patient or authorized representative who has indicated his/her understanding and acceptance.       Plan Discussed with: Anesthesiologist  Anesthesia Plan Comments:         Anesthesia Quick Evaluation

## 2019-07-11 NOTE — Progress Notes (Signed)
Labor Progress Note Jennifer Ellison is a 34 y.o. L6D4370 at [redacted]w[redacted]d presented for IOL for GDM  S:  Comfortable with epidural, feeling rectal pressure.  O:  BP 120/69   Pulse 96   Temp 98.4 F (36.9 C) (Oral)   Resp 18   Ht 5' 2.25" (1.581 m)   Wt (!) 139.7 kg   LMP 10/11/2018   SpO2 98%   BMI 55.86 kg/m  EFM: baseline 135 bpm/ mod variability/ + accels/ occ variable decels  Toco/IUPC: 2-5 SVE: Dilation: 5 Effacement (%): 70 Station: -2 Presentation: Vertex Exam by:: Fabian November, CNM Pitocin: 6 mu/min  A/P: 34 y.o. [id number] [redacted]w[redacted]d  1. Labor: latent 2. FWB: Cat II 3. Pain: epidural 4. GDM: stable  SROM with clear fluid during VE. Continue Pitocin titration. Anticipate SVD.  Donette Larry, CNM 4:19 PM

## 2019-07-11 NOTE — Progress Notes (Addendum)
Labor Progress Note Jennifer Ellison is a 34 y.o. P0Y5110 at [redacted]w[redacted]d presented for IOL for A1GDM  S:  Starting to feel ctx q10 min.   O:  BP 133/86   Pulse (!) 114   Temp 98.2 F (36.8 C) (Oral)   Resp 18   Ht 5' 2.25" (1.581 m)   Wt (!) 139.7 kg   LMP 10/11/2018   BMI 55.86 kg/m  EFM: baseline 125 bpm/ mod variability/ + accels/ no decels  Toco/IUPC: irregular SVE: deferred  A/P: 34 y.o. Y1R1735 [redacted]w[redacted]d  1. Labor: latent 2. FWB: Cat I 3. Pain: analgesia prn 4. GDM: CBG 161, given 2U Insulin  Continue to monitor CBG, consider endotool. S/p FB and Cytotec. Continue ripening. Pitocin once FB out. Anticipate SVD.  Donette Larry, CNM 9:34 AM

## 2019-07-12 ENCOUNTER — Encounter (HOSPITAL_COMMUNITY): Payer: Self-pay | Admitting: Obstetrics and Gynecology

## 2019-07-12 DIAGNOSIS — Z3A39 39 weeks gestation of pregnancy: Secondary | ICD-10-CM

## 2019-07-12 DIAGNOSIS — O24429 Gestational diabetes mellitus in childbirth, unspecified control: Secondary | ICD-10-CM

## 2019-07-12 DIAGNOSIS — O99824 Streptococcus B carrier state complicating childbirth: Secondary | ICD-10-CM

## 2019-07-12 DIAGNOSIS — Z30430 Encounter for insertion of intrauterine contraceptive device: Secondary | ICD-10-CM

## 2019-07-12 LAB — CBC
HCT: 41.4 % (ref 36.0–46.0)
Hemoglobin: 13.5 g/dL (ref 12.0–15.0)
MCH: 29.7 pg (ref 26.0–34.0)
MCHC: 32.6 g/dL (ref 30.0–36.0)
MCV: 91 fL (ref 80.0–100.0)
Platelets: 277 10*3/uL (ref 150–400)
RBC: 4.55 MIL/uL (ref 3.87–5.11)
RDW: 14.8 % (ref 11.5–15.5)
WBC: 19.6 10*3/uL — ABNORMAL HIGH (ref 4.0–10.5)
nRBC: 0 % (ref 0.0–0.2)

## 2019-07-12 LAB — GLUCOSE, CAPILLARY: Glucose-Capillary: 118 mg/dL — ABNORMAL HIGH (ref 70–99)

## 2019-07-12 MED ORDER — PRENATAL MULTIVITAMIN CH
1.0000 | ORAL_TABLET | Freq: Every day | ORAL | Status: DC
  Administered 2019-07-12 – 2019-07-13 (×2): 1 via ORAL
  Filled 2019-07-12 (×2): qty 1

## 2019-07-12 MED ORDER — SENNOSIDES-DOCUSATE SODIUM 8.6-50 MG PO TABS
2.0000 | ORAL_TABLET | ORAL | Status: DC
  Administered 2019-07-12: 2 via ORAL
  Filled 2019-07-12: qty 2

## 2019-07-12 MED ORDER — IBUPROFEN 600 MG PO TABS
600.0000 mg | ORAL_TABLET | Freq: Four times a day (QID) | ORAL | Status: DC
  Administered 2019-07-12 – 2019-07-13 (×6): 600 mg via ORAL
  Filled 2019-07-12 (×6): qty 1

## 2019-07-12 MED ORDER — COCONUT OIL OIL: 1.0000 "application " | TOPICAL_OIL | Status: DC | PRN

## 2019-07-12 MED ORDER — ZOLPIDEM TARTRATE 5 MG PO TABS: 5.0000 mg | ORAL_TABLET | Freq: Every evening | ORAL | Status: DC | PRN

## 2019-07-12 MED ORDER — ONDANSETRON HCL 4 MG/2ML IJ SOLN: 4.0000 mg | INTRAMUSCULAR | Status: DC | PRN

## 2019-07-12 MED ORDER — ACETAMINOPHEN 325 MG PO TABS
650.0000 mg | ORAL_TABLET | ORAL | Status: DC | PRN
  Administered 2019-07-12 – 2019-07-13 (×5): 650 mg via ORAL
  Filled 2019-07-12 (×5): qty 2

## 2019-07-12 MED ORDER — DIPHENHYDRAMINE HCL 25 MG PO CAPS: 25.0000 mg | ORAL_CAPSULE | Freq: Four times a day (QID) | ORAL | Status: DC | PRN

## 2019-07-12 MED ORDER — ONDANSETRON HCL 4 MG PO TABS: 4.0000 mg | ORAL_TABLET | ORAL | Status: DC | PRN

## 2019-07-12 MED ORDER — METHYLERGONOVINE MALEATE 0.2 MG/ML IJ SOLN: 0.2000 mg | Freq: Once | INTRAMUSCULAR | Status: AC

## 2019-07-12 MED ORDER — BENZOCAINE-MENTHOL 20-0.5 % EX AERO: 1.0000 "application " | INHALATION_SPRAY | CUTANEOUS | Status: DC | PRN

## 2019-07-12 MED ORDER — TETANUS-DIPHTH-ACELL PERTUSSIS 5-2.5-18.5 LF-MCG/0.5 IM SUSP
0.5000 mL | Freq: Once | INTRAMUSCULAR | Status: AC
  Administered 2019-07-13: 0.5 mL via INTRAMUSCULAR
  Filled 2019-07-12: qty 0.5

## 2019-07-12 MED ORDER — SIMETHICONE 80 MG PO CHEW: 80.0000 mg | CHEWABLE_TABLET | ORAL | Status: DC | PRN

## 2019-07-12 MED ORDER — WITCH HAZEL-GLYCERIN EX PADS: 1.0000 "application " | MEDICATED_PAD | CUTANEOUS | Status: DC | PRN

## 2019-07-12 MED ORDER — DIBUCAINE (PERIANAL) 1 % EX OINT: 1.0000 "application " | TOPICAL_OINTMENT | CUTANEOUS | Status: DC | PRN

## 2019-07-12 MED ORDER — METHYLERGONOVINE MALEATE 0.2 MG/ML IJ SOLN
INTRAMUSCULAR | Status: AC
  Administered 2019-07-12: 0.2 mg via INTRAMUSCULAR
  Filled 2019-07-12: qty 1

## 2019-07-12 NOTE — Plan of Care (Signed)
  Problem: Activity: Goal: Ability to tolerate increased activity will improve Outcome: Completed/Met Note: Discussed with patient the importance of increasing activity throughout the day to aid in back pain. Also discussed the importance of emptying bladder at least every 2-3 hours to prevent increased bleeding and cramping. Provided heating packs for back and abdomen and discussed pain medication. Maxwell Caul, Leretha Dykes [place]

## 2019-07-12 NOTE — Progress Notes (Signed)
POSTPARTUM PROGRESS NOTE  Post Partum Day 1  Subjective:  Jennifer Ellison is a 34 y.o. V7D4446 s/p NSVD at [redacted]w[redacted]d.  She reports she is doing well. Not yet ambulating. Denies nausea or vomiting.  Pain is well controlled.  Lochia is appropriate.  Objective: Blood pressure 118/69, pulse 96, temperature 98.6 F (37 C), temperature source Oral, resp. rate 16, height 5' 2.25" (1.581 m), weight (!) 139.7 kg, last menstrual period 10/11/2018, SpO2 98 %, unknown if currently breastfeeding.  Physical Exam:  General: alert, cooperative and no distress Chest: no respiratory distress Heart:regular rate, distal pulses intact Abdomen: soft, nontender,  Uterine Fundus: firm, appropriately tender DVT Evaluation: No calf swelling or tenderness Extremities: no LE edema Skin: warm, dry  Recent Labs    07/11/19 0714  HGB 13.6  HCT 42.2    Assessment/Plan: Jennifer Ellison is a 34 y.o. F9U1222 s/p NSVD at [redacted]w[redacted]d   PPD#1 - Doing well  Routine postpartum care Contraception: post placental IUD placed after delivery Feeding: Breast and bottle Dispo: Plan for discharge PPD#2 (late delivery) GDMA1: check fasting CBG.   LOS: 1 day   Zack Seal, MD/[facility] OB Fellow  07/12/2019, 2:34 AM

## 2019-07-12 NOTE — Lactation Note (Signed)
This note was copied from a baby's chart. Lactation Consultation Note  Patient Name: Girl Mitsy Owen LTGAI'D Date: 07/12/2019   P3, Baby 9 hours old and latching upon entering. Mother did not breastfeed her first child and breastfed her second child for 3 mos. She states she may breast and formula feed. Reviewed supply and demand and encouraged bf before offering formula.  Feed on demand with cues.  Goal 8-12+ times per day after first 24 hrs.  Place baby STS if not cueing.  Baby recently bf on R breast for 45 min. Reviewed hand expression and basics. Mom made aware of O/P services, breastfeeding support groups, community resources, and our phone # for post-discharge questions.       Maternal Data    Feeding Feeding Type: Breast Fed  LATCH Score                   Interventions    Lactation Tools Discussed/Used     Consult Status      Hardie Pulley 07/12/2019, 9:32 AM

## 2019-07-12 NOTE — Lactation Note (Signed)
This note was copied from a baby's chart. Lactation Consultation Note Attempted to see mom. Mom woke up when [facility] opened door. Mom stated she was resting. LC stated Lactation would come back to see her. Call if needed before.  Patient Name: Jennifer Ellison [id number] Date: 07/12/2019     Maternal Data    Feeding Feeding Type: Breast Fed  LATCH Score Latch: Repeated attempts needed to sustain latch, nipple held in mouth throughout feeding, stimulation needed to elicit sucking reflex.  Audible Swallowing: A few with stimulation  Type of Nipple: Everted at rest and after stimulation  Comfort (Breast/Nipple): Soft / non-tender  Hold (Positioning): Assistance needed to correctly position infant at breast and maintain latch.  LATCH Score: 7  Interventions Interventions: Skin to skin;Adjust position  Lactation Tools Discussed/Used     Consult Status      Charyl Dancer 07/12/2019, 4:40 AM

## 2019-07-12 NOTE — Anesthesia Postprocedure Evaluation (Signed)
Anesthesia Post Note  Patient: Jennifer Ellison  Procedure(s) Performed: AN AD HOC LABOR EPIDURAL     Patient location during evaluation: Mother Baby Anesthesia Type: Epidural Level of consciousness: awake and alert Pain management: pain level controlled Vital Signs Assessment: post-procedure vital signs reviewed and stable Respiratory status: spontaneous breathing, nonlabored ventilation and respiratory function stable Cardiovascular status: stable Postop Assessment: no headache, no backache and epidural receding Anesthetic complications: no    Last Vitals:  Vitals:   07/12/19 0232 07/12/19 0330  BP: 126/82 127/76  Pulse: 98 (!) 102  Resp: 20 20  Temp: 36.9 C 36.7 C  SpO2: 100% 96%    Last Pain:  Vitals:   07/12/19 0511  TempSrc:   PainSc: 5    Pain Goal:                   Junious Silk

## 2019-07-12 NOTE — Progress Notes (Signed)
CSW received consult for hx of Anxiety.  CSW met with MOB to offer support and complete assessment.    CSW congratulated MOB on the birth of infant. CSW advised MOB of CSW's role and the reason for CSW coming to visit with her. MOB reported that she was never diagnosed with anxiety but called her doctor one day to advise him of her "red cheeks". MOB reported that MD advised her that it was just her anxiety. MOB reports that since that day, "I haven't had any anxiety". MOB reported that she was never placed on medications or in therapy as she doesn't need it. MOB denies SI, HI, and being in a DV relationship. MOB reported that she has support from her family and expressed no other concerns or desire to have any other resources given at this time.   CSW provided education regarding the baby blues period vs. perinatal mood disorders, discussed treatment and gave resources for mental health follow up if concerns arise.  CSW recommends self-evaluation during the postpartum time period using the New Mom Checklist from Postpartum Progress and encouraged MOB to contact a medical professional if symptoms are noted at any time.   CSW provided review of Sudden Infant Death Syndrome (SIDS) precautions.   CSW identifies no further need for intervention and no barriers to discharge at this time.    Million Maharaj S. Barnie Sopko, MSW, LCSW [facility] [place] [phone]   

## 2019-07-12 NOTE — Discharge Summary (Signed)
Postpartum Discharge Summary     Patient Name: Jennifer Ellison DOB: [birth year] MRN: [id number]  Date of admission: 07/11/2019 Delivering Provider: Clarnce Flock   Date of discharge: 07/13/2019  Admitting diagnosis: Gestational diabetes [O24.419] Intrauterine pregnancy: [redacted]w[redacted]d    Secondary diagnosis:  Active Problems:   Class 3 severe obesity due to excess calories without serious comorbidity with body mass index (BMI) of 45.0 to 49.9 in adult ([facility]   Supervision of high-risk pregnancy   Group B streptococcal bacteriuria   GDM (gestational diabetes mellitus)   Gestational diabetes   NSVD (normal spontaneous vaginal delivery)   Encounter for insertion of intrauterine contraceptive device (IUD)  Additional problems: none     Discharge diagnosis: Term Pregnancy Delivered and GDM A1                                                                                                Post partum procedures:none  Augmentation: Pitocin, Cytotec and Foley Balloon  Complications: None  Hospital course:  Induction of Labor With Vaginal Delivery   34y.o. yo [id number] admitted to the hospital 07/11/2019 for induction of labor.  Indication for induction: A1 DM.  Patient had an uncomplicated labor course as follows: induced with foley bulb and misoprostol, after cervix favorable started on pitocin and had SROM, progressed slowly to complete and had NSVD.  Membrane Rupture Time/Date: 4:15 PM ,07/11/2019   Intrapartum Procedures: Episiotomy: None [1]                                         Lacerations:  Periurethral [8]  Patient had delivery of a Viable infant.  Information for the patient's newborn:  RoRochel, Privett0[[id number]]Delivery Method: Vaginal, Spontaneous(Filed from Delivery Summary)    07/11/2019  Details of delivery can be found in separate delivery note.  Patient had a routine postpartum course. Patient is discharged home 07/13/19. Delivery time: 11:55 PM     Magnesium Sulfate received: No BMZ received: No Rhophylac:N/A MMR:N/A Transfusion:No  Physical exam  Vitals:   07/12/19 1130 07/12/19 1540 07/12/19 2143 07/13/19 0518  BP: 138/75 112/63 132/76 129/87  Pulse: 98 86 88 93  Resp: '18 19 20 20  ' Temp: 98 F (36.7 C) 98.4 F (36.9 C) 97.6 F (36.4 C) 97.6 F (36.4 C)  TempSrc: Oral Oral Oral Oral  SpO2: 97% 97% 98% 100%  Weight:      Height:       General: alert, cooperative and no distress Lochia: appropriate Uterine Fundus: firm Incision: N/A DVT Evaluation: No evidence of DVT seen on physical exam. Labs: Lab Results  Component Value Date   WBC 19.6 (H) 07/12/2019   HGB 13.5 07/12/2019   HCT 41.4 07/12/2019   MCV 91.0 07/12/2019   PLT 277 07/12/2019   CMP Latest Ref Rng & Units 11/24/2018  Glucose 70 - 99 mg/dL 103(H)  BUN 6 - 20 mg/dL 9  Creatinine 0.44 - 1.00 mg/dL 0.61  Sodium 135 -  145 mmol/L 138  Potassium 3.5 - 5.1 mmol/L 3.4(L)  Chloride 98 - 111 mmol/L 105  CO2 22 - 32 mmol/L 24  Calcium 8.9 - 10.3 mg/dL 9.2  Total Protein 6.5 - 8.1 g/dL 7.5  Total Bilirubin 0.3 - 1.2 mg/dL 0.2(L)  Alkaline Phos 38 - 126 U/L 70  AST 15 - 41 U/L 36  ALT 0 - 44 U/L 39   Edinburgh Score: Edinburgh Postnatal Depression Scale Screening Tool 07/12/2019  I have been able to laugh and see the funny side of things. 0  I have looked forward with enjoyment to things. 0  I have blamed myself unnecessarily when things went wrong. 0  I have been anxious or worried for no good reason. 0  I have felt scared or panicky for no good reason. 0  Things have been getting on top of me. 0  I have been so unhappy that I have had difficulty sleeping. 0  I have felt sad or miserable. 0  I have been so unhappy that I have been crying. 0  The thought of harming myself has occurred to me. 0  Edinburgh Postnatal Depression Scale Total 0    Discharge instruction: per After Visit Summary and "Baby and Me Booklet".  After visit meds:   Allergies as of 07/13/2019      Reactions   Blueberry Flavor Swelling      Medication List    TAKE these medications   Accu-Chek Guide test strip Generic drug: glucose blood Use as instructed   Accu-Chek Softclix Lancets lancets Use as instructed   Blood Pressure Cuff Misc 1 Device by Does not apply route once a week.   cetirizine 10 MG tablet Commonly known as: ZYRTEC TAKE ONE TABLET BY MOUTH DAILY   Comfort Fit Maternity Supp Sm Misc Wear as directed.   ibuprofen 600 MG tablet Commonly known as: ADVIL Take 1 tablet (600 mg total) by mouth every 6 (six) hours.   PrePLUS 27-1 MG Tabs Take 1 tablet by mouth daily.       Diet: carb modified diet  Activity: Advance as tolerated. Pelvic rest for 6 weeks.   Outpatient follow up:6 weeks Follow up Appt: Future Appointments  Date Time Provider Hato Candal  08/22/2019  8:15 AM [facility] [place] None  08/22/2019  8:30 AM Woodroe Mode, MD [place] None   Follow up Visit: [place]. Schedule an appointment as soon as possible for a visit in 4 week(s).   Specialty: Obstetrics and Gynecology Contact information: [address], [place] [phone]           Please schedule this patient for Postpartum visit in: 6 weeks with the following provider: Any provider In-Person For C/S patients schedule nurse incision check in weeks 2 weeks: no High risk pregnancy complicated by: GDM Delivery mode:  SVD Anticipated Birth Control:  PP IUD Placed PP Procedures needed: 2hr GTT, IUD string check  Schedule Integrated BH visit: no   Newborn Data: Live born female  Birth Weight:  6+12 APGAR: 74, 9  Newborn Delivery   Birth date/time: 07/11/2019 23:55:00 Delivery type: Vaginal, Spontaneous      Baby Feeding: Bottle and Breast Disposition:home with mother   07/13/2019 Hansel Feinstein, CNM

## 2019-07-12 NOTE — Progress Notes (Signed)
RN spoke with Jennifer Ellison of faculty practice about pt CBG being 118. RN was notified not to apply any interventions. Elam Dutch

## 2019-07-12 NOTE — Procedures (Signed)
Post-Placental IUD Insertion Procedure Note  Patient identified, informed consent signed prior to delivery, signed copy in chart, time out was performed.    Vaginal, labial and perineal areas thoroughly inspected for lacerations. Hemostatic left periclitoral laceration identified and not repaired prior to insertion of Liletta IUD.  - IUD grasped between sterile gloved fingers. Sterile lubrication applied to sterile gloved hand for ease of insertion. Fundus identified through abdominal wall using non-insertion hand. IUD inserted to fundus with bimanual technique. IUD carefully released at the fundus and insertion hand gently removed from vagina.    Strings trimmed to the level of the cervix. Patient tolerated procedure well.  Lot # [id number] Expiration Date09/03/2022  Patient given post procedure instructions and IUD care card with expiration date.  Patient is asked to keep IUD strings tucked in her vagina until her postpartum follow up visit in 4-6 weeks. Patient advised to abstain from sexual intercourse and pulling on strings before her follow-up visit. Patient verbalized an understanding of the plan of care and agrees.

## 2019-07-13 MED ORDER — IBUPROFEN 600 MG PO TABS: 600.0000 mg | ORAL_TABLET | Freq: Four times a day (QID) | ORAL | 0 refills | Status: DC

## 2019-07-13 NOTE — Lactation Note (Signed)
This note was copied from a baby's chart. Lactation Consultation Note  Patient Name: Jennifer Ellison [id number]'[id number] Date: 07/13/2019 Reason for consult: Follow-up assessment  P3 mother whose infant is now 40 hours old.  This is a term baby at 39+0 weeks.  Mother did not breast feed her first child and breast fed her second child for three months.  Mother has begun supplementing with formula.  Encouraged mother to continue latching to the breast prior to any formula supplementation.  Mother feels like she doesn't "have enough" for baby at this time.  Discussed milk coming to volume and how to best obtain a full milk supply.    Baby is awake and mother will attempt to breast feed again.  She wanted to try on her own but encouraged to call for assistance as needed.  Suggested mother post pump at home if she is concerned about her milk supply.  She is planning to get a DEBP soon.  Offered the manual pump and mother accepted.  At this time, she does not desire my assistance to show her how to use it but may call me back after she feeds baby.  Also suggested continued hand expression.  I will await for mother's return call and further discuss concerns.  RN updated.   Maternal Data    Feeding    LATCH Score                   Interventions    Lactation Tools Discussed/Used     Consult Status Consult Status: Complete Date: 07/13/19 Follow-up type: Call as needed    Jennifer Ellison R Jennifer Ellison 07/13/2019, 8:12 AM

## 2019-07-13 NOTE — Progress Notes (Signed)
AVS printed and discharged instructions given to patient. Patient instructed to call for follow up appointment. Pt verbalized understanding and all questions answered.

## 2019-07-13 NOTE — Discharge Instructions (Signed)

## 2019-07-16 ENCOUNTER — Encounter (HOSPITAL_COMMUNITY): Payer: Self-pay | Admitting: *Deleted

## 2019-07-16 ENCOUNTER — Other Ambulatory Visit: Payer: Self-pay

## 2019-07-16 ENCOUNTER — Emergency Department (HOSPITAL_COMMUNITY)
Admission: EM | Admit: 2019-07-16 | Discharge: 2019-07-17 | Discharge status: Against Medical Advice | Payer: Medicaid Other | Attending: Emergency Medicine | Admitting: Emergency Medicine

## 2019-07-16 DIAGNOSIS — R109 Unspecified abdominal pain: Secondary | ICD-10-CM | POA: Diagnosis not present

## 2019-07-16 DIAGNOSIS — O1205 Gestational edema, complicating the puerperium: Secondary | ICD-10-CM | POA: Diagnosis not present

## 2019-07-16 DIAGNOSIS — O1495 Unspecified pre-eclampsia, complicating the puerperium: Secondary | ICD-10-CM | POA: Diagnosis not present

## 2019-07-16 DIAGNOSIS — Z5321 Procedure and treatment not carried out due to patient leaving prior to being seen by health care provider: Secondary | ICD-10-CM | POA: Insufficient documentation

## 2019-07-16 DIAGNOSIS — K5909 Other constipation: Secondary | ICD-10-CM | POA: Diagnosis not present

## 2019-07-16 DIAGNOSIS — R1110 Vomiting, unspecified: Secondary | ICD-10-CM | POA: Insufficient documentation

## 2019-07-16 LAB — CBC
HCT: 37.7 % (ref 36.0–46.0)
Hemoglobin: 11.9 g/dL — ABNORMAL LOW (ref 12.0–15.0)
MCH: 28.9 pg (ref 26.0–34.0)
MCHC: 31.6 g/dL (ref 30.0–36.0)
MCV: 91.5 fL (ref 80.0–100.0)
Platelets: 314 10*3/uL (ref 150–400)
RBC: 4.12 MIL/uL (ref 3.87–5.11)
RDW: 15 % (ref 11.5–15.5)
WBC: 10.7 10*3/uL — ABNORMAL HIGH (ref 4.0–10.5)
nRBC: 0 % (ref 0.0–0.2)

## 2019-07-16 LAB — URINALYSIS, ROUTINE W REFLEX MICROSCOPIC
Bilirubin Urine: NEGATIVE
Glucose, UA: NEGATIVE mg/dL
Ketones, ur: NEGATIVE mg/dL
Nitrite: NEGATIVE
Protein, ur: NEGATIVE mg/dL
Specific Gravity, Urine: 1.004 — ABNORMAL LOW (ref 1.005–1.030)
pH: 5 (ref 5.0–8.0)

## 2019-07-16 LAB — I-STAT BETA HCG BLOOD, ED (MC, WL, AP ONLY): I-stat hCG, quantitative: 205.9 m[IU]/mL — ABNORMAL HIGH (ref <5)

## 2019-07-16 MED ORDER — SODIUM CHLORIDE 0.9% FLUSH: 3.0000 mL | Freq: Once | INTRAVENOUS | Status: DC

## 2019-07-16 NOTE — ED Triage Notes (Signed)
Pt reports vaginal birth on wed, dc home on Friday. Had n/v after birth and reports foley cath had to be placed. Pt reprots now having increase in cramping abd pain, denies heavy bleeding. Had painful bm and now has rectal bleeding. Reports difficulty urinating and incontinence.

## 2019-07-17 ENCOUNTER — Other Ambulatory Visit: Payer: Self-pay

## 2019-07-17 ENCOUNTER — Ambulatory Visit (INDEPENDENT_AMBULATORY_CARE_PROVIDER_SITE_OTHER): Payer: Medicaid Other | Admitting: Obstetrics and Gynecology

## 2019-07-17 ENCOUNTER — Encounter: Payer: Self-pay | Admitting: Obstetrics and Gynecology

## 2019-07-17 ENCOUNTER — Inpatient Hospital Stay (EMERGENCY_DEPARTMENT_HOSPITAL)
Admission: AD | Admit: 2019-07-17 | Discharge: 2019-07-17 | Disposition: A | Discharge status: Home / Self Care | Payer: Medicaid Other | Source: Ambulatory Visit | Attending: Obstetrics & Gynecology | Admitting: Obstetrics & Gynecology

## 2019-07-17 ENCOUNTER — Inpatient Hospital Stay (HOSPITAL_COMMUNITY): Payer: Medicaid Other

## 2019-07-17 ENCOUNTER — Encounter (HOSPITAL_COMMUNITY): Payer: Self-pay | Admitting: Obstetrics & Gynecology

## 2019-07-17 VITALS — BP 153/91 | HR 86 | Wt 297.2 lb

## 2019-07-17 DIAGNOSIS — O9279 Other disorders of lactation: Secondary | ICD-10-CM | POA: Insufficient documentation

## 2019-07-17 DIAGNOSIS — R252 Cramp and spasm: Secondary | ICD-10-CM

## 2019-07-17 DIAGNOSIS — O1205 Gestational edema, complicating the puerperium: Secondary | ICD-10-CM | POA: Insufficient documentation

## 2019-07-17 DIAGNOSIS — O99893 Other specified diseases and conditions complicating puerperium: Secondary | ICD-10-CM | POA: Insufficient documentation

## 2019-07-17 DIAGNOSIS — O1495 Unspecified pre-eclampsia, complicating the puerperium: Secondary | ICD-10-CM

## 2019-07-17 DIAGNOSIS — O9089 Other complications of the puerperium, not elsewhere classified: Secondary | ICD-10-CM

## 2019-07-17 DIAGNOSIS — R030 Elevated blood-pressure reading, without diagnosis of hypertension: Secondary | ICD-10-CM

## 2019-07-17 DIAGNOSIS — K5909 Other constipation: Secondary | ICD-10-CM | POA: Insufficient documentation

## 2019-07-17 DIAGNOSIS — R109 Unspecified abdominal pain: Secondary | ICD-10-CM

## 2019-07-17 DIAGNOSIS — R350 Frequency of micturition: Secondary | ICD-10-CM

## 2019-07-17 HISTORY — DX: Unspecified pre-eclampsia, complicating the puerperium: O14.95

## 2019-07-17 LAB — COMPREHENSIVE METABOLIC PANEL
ALT: 55 U/L — ABNORMAL HIGH (ref 0–44)
ALT: 52 U/L — ABNORMAL HIGH (ref 0–44)
AST: 51 U/L — ABNORMAL HIGH (ref 15–41)
AST: 44 U/L — ABNORMAL HIGH (ref 15–41)
Albumin: 2.8 g/dL — ABNORMAL LOW (ref 3.5–5.0)
Albumin: 2.9 g/dL — ABNORMAL LOW (ref 3.5–5.0)
Alkaline Phosphatase: 103 U/L (ref 38–126)
Alkaline Phosphatase: 97 U/L (ref 38–126)
Anion gap: 12 (ref 5–15)
Anion gap: 10 (ref 5–15)
BUN: 5 mg/dL — ABNORMAL LOW (ref 6–20)
BUN: 5 mg/dL — ABNORMAL LOW (ref 6–20)
CO2: 23 mmol/L (ref 22–32)
CO2: 27 mmol/L (ref 22–32)
Calcium: 8.9 mg/dL (ref 8.9–10.3)
Calcium: 8.9 mg/dL (ref 8.9–10.3)
Chloride: 107 mmol/L (ref 98–111)
Chloride: 106 mmol/L (ref 98–111)
Creatinine, Ser: 0.69 mg/dL (ref 0.44–1.00)
Creatinine, Ser: 0.86 mg/dL (ref 0.44–1.00)
GFR calc Af Amer: >60 mL/min (ref >60)
GFR calc Af Amer: >60 mL/min (ref >60)
GFR calc non Af Amer: >60 mL/min (ref >60)
GFR calc non Af Amer: >60 mL/min (ref >60)
Glucose, Bld: 87 mg/dL (ref 70–99)
Glucose, Bld: 97 mg/dL (ref 70–99)
Potassium: 3.5 mmol/L (ref 3.5–5.1)
Potassium: 3.6 mmol/L (ref 3.5–5.1)
Sodium: 142 mmol/L (ref 135–145)
Sodium: 143 mmol/L (ref 135–145)
Total Bilirubin: 0.5 mg/dL (ref 0.3–1.2)
Total Bilirubin: 0.6 mg/dL (ref 0.3–1.2)
Total Protein: 6.1 g/dL — ABNORMAL LOW (ref 6.5–8.1)
Total Protein: 6.4 g/dL — ABNORMAL LOW (ref 6.5–8.1)

## 2019-07-17 LAB — URINALYSIS, ROUTINE W REFLEX MICROSCOPIC
Bilirubin Urine: NEGATIVE
Glucose, UA: NEGATIVE mg/dL
Ketones, ur: NEGATIVE mg/dL
Leukocytes,Ua: NEGATIVE
Nitrite: NEGATIVE
Protein, ur: NEGATIVE mg/dL
Specific Gravity, Urine: 1.006 (ref 1.005–1.030)
pH: 5 (ref 5.0–8.0)

## 2019-07-17 LAB — POCT URINALYSIS DIPSTICK
Bilirubin, UA: NEGATIVE
Glucose, UA: NEGATIVE
Ketones, UA: NEGATIVE
Nitrite, UA: NEGATIVE
Protein, UA: NEGATIVE
Spec Grav, UA: 1.02 (ref 1.010–1.025)
Urobilinogen, UA: 0.2 E.U./dL
pH, UA: 7 (ref 5.0–8.0)

## 2019-07-17 LAB — CBC
HCT: 38.4 % (ref 36.0–46.0)
Hemoglobin: 12.1 g/dL (ref 12.0–15.0)
MCH: 29.4 pg (ref 26.0–34.0)
MCHC: 31.5 g/dL (ref 30.0–36.0)
MCV: 93.2 fL (ref 80.0–100.0)
Platelets: 315 10*3/uL (ref 150–400)
RBC: 4.12 MIL/uL (ref 3.87–5.11)
RDW: 15.2 % (ref 11.5–15.5)
WBC: 9.9 10*3/uL (ref 4.0–10.5)
nRBC: 0 % (ref 0.0–0.2)

## 2019-07-17 LAB — PROTEIN / CREATININE RATIO, URINE
Creatinine, Urine: 60.78 mg/dL
Protein Creatinine Ratio: 0.18 mg/mg{Cre} — ABNORMAL HIGH (ref 0.00–0.15)
Total Protein, Urine: 11 mg/dL

## 2019-07-17 LAB — LIPASE, BLOOD: Lipase: 21 U/L (ref 11–51)

## 2019-07-17 MED ORDER — DOCUSATE SODIUM 100 MG PO CAPS: 200.0000 mg | ORAL_CAPSULE | Freq: Two times a day (BID) | ORAL | 2 refills | Status: DC | PRN

## 2019-07-17 MED ORDER — FUROSEMIDE 20 MG PO TABS
20.0000 mg | ORAL_TABLET | Freq: Every day | ORAL | 0 refills | Status: DC
Start: 2019-07-17 — End: 2019-08-03

## 2019-07-17 MED ORDER — FLEET ENEMA 7-19 GM/118ML RE ENEM
1.0000 | ENEMA | Freq: Once | RECTAL | Status: AC
  Administered 2019-07-17: 1 via RECTAL

## 2019-07-17 MED ORDER — FUROSEMIDE 20 MG PO TABS
20.0000 mg | ORAL_TABLET | Freq: Once | ORAL | Status: AC
  Administered 2019-07-17: 20 mg via ORAL
  Filled 2019-07-17: qty 1

## 2019-07-17 MED ORDER — POTASSIUM CHLORIDE ER 20 MEQ PO TBCR: 10.0000 meq | EXTENDED_RELEASE_TABLET | Freq: Every day | ORAL | 0 refills | Status: DC

## 2019-07-17 MED ORDER — DOCUSATE SODIUM 100 MG PO CAPS
200.0000 mg | ORAL_CAPSULE | Freq: Once | ORAL | Status: AC
  Administered 2019-07-17: 200 mg via ORAL
  Filled 2019-07-17: qty 2

## 2019-07-17 MED ORDER — PHENYLEPH-SHARK LIV OIL-MO-PET 0.25-3-14-71.9 % RE OINT
1.0000 | TOPICAL_OINTMENT | Freq: Two times a day (BID) | RECTAL | 0 refills | Status: DC | PRN
Start: 2019-07-17 — End: 2023-05-05

## 2019-07-17 MED ORDER — AMLODIPINE BESYLATE 5 MG PO TABS: 5.0000 mg | ORAL_TABLET | Freq: Every day | ORAL | 1 refills | Status: DC

## 2019-07-17 MED ORDER — AMLODIPINE BESYLATE 5 MG PO TABS
5.0000 mg | ORAL_TABLET | Freq: Every day | ORAL | Status: DC
  Administered 2019-07-17: 17:00:00 5 mg via ORAL
  Filled 2019-07-17: qty 1

## 2019-07-17 MED ORDER — DOCUSATE SODIUM 100 MG PO CAPS
100.0000 mg | ORAL_CAPSULE | Freq: Two times a day (BID) | ORAL | 2 refills | Status: DC | PRN
Start: 2019-07-17 — End: 2019-07-17

## 2019-07-17 NOTE — Lactation Note (Signed)
Lactation Consultation Note  Patient Name: Emberley Kral [id number] Date: 07/17/2019   Mother is in MAU with N&V.  Mother states she is having trouble latching baby on R side. Mother has purchased a DEBP for home use now.  Mother pumped once,  2 days ago & was disappointed with her supply. During consult mother was taken to xray. Suggest the mother start pumping q 3 hours to increase her supply and recommend outpatient appt to work on latching.      Maternal Data    Feeding    LATCH Score                   Interventions    Lactation Tools Discussed/Used     Consult Status      Hardie Pulley 07/17/2019, 3:17 PM

## 2019-07-17 NOTE — Progress Notes (Signed)
Pt is here for abdominal pain that has been going on for a few days. Pt had in and out cath after delivery on 07/12/19 when she was having these symptoms before. Pt reports she is vomiting and having urinary urgency and frequency. Pt is currently breast feeding.

## 2019-07-17 NOTE — MAU Note (Signed)
Pt states that she has swelling in both her legs since Sunday.   Pt reports nausea and vomiting since Sunday and feeling dizzy.

## 2019-07-17 NOTE — MAU Provider Note (Signed)
History     CSN: [id number]  Arrival date and time: 07/17/19 1118   None     Chief Complaint  Patient presents with  . Leg Swelling  . Emesis  . Nausea   HPI   [name] is a 34 y.o. female [phone] status post vaginal delivery on 4/22 here with nausea, vomiting and leg swelling. She was discharged home on 4/23 and says the bilateral leg swelling has worsened. She was an Induction of labor; total labor was 13 hours.  She presented to the ED last night d/t nausea and vomiting and was told it was a 9 hour wait and she left. She was given a same day appointment in the office today. She was seen in the office today and had elevated BP's and was sent here for further evaluation. She does attests to constipation for the last few days however now having soft, regular BM's. She went 4 days without a BM and then had a large BM.  Says in the last 24 hours she has vomited 1 time. She ate 2 oranges today and did not vomit after. + Decreased appetite, does not have the urge to eat. No diarrhea.  Last vomit was yesterday afternoon, no vomiting today.  Bilateral leg swelling that started after delivery. No pain in her legs, no chest pain or SOB. The swelling goes from her toes to bilateral thighs. There is not erythema. She is able to ambulate normally. + urinary incontinence. States she is able to make it to the bathroom however the second she sits down there is no delay in urination. She is concerned she may not make it to the bathroom in time.   She denies HA or scotoma.   OB History    Gravida  5   Para  3   Term  3   Preterm      AB  2   Living  3     SAB  2   TAB      Ectopic      Multiple  0   Live Births  3           Past Medical History:  Diagnosis Date  . Anxiety   . Blood transfusion without reported diagnosis   . Gestational diabetes   . History of postpartum hemorrhage   . History of sinusitis   . Hypoglycemia   . Obese   . Obesity   .  Pregnancy 09/2018    Past Surgical History:  Procedure Laterality Date  . DILATION AND CURETTAGE OF UTERUS  2008   retained products after vaginal delivery  . LEFT OOPHORECTOMY  2012   left ovary removed; cyst related    Family History  Problem Relation Age of Onset  . Asthma Brother   . Diabetes Maternal Grandmother     Social History   Tobacco Use  . Smoking status: Never Smoker  . Smokeless tobacco: Never Used  Substance Use Topics  . Alcohol use: Not Currently  . Drug use: No    Allergies:  Allergies  Allergen Reactions  . Blueberry Flavor Swelling    Medications Prior to Admission  Medication Sig Dispense Refill Last Dose  . ibuprofen (ADVIL) 600 MG tablet Take 1 tablet (600 mg total) by mouth every 6 (six) hours. 30 tablet 0 07/16/2019 at Unknown time  . Prenatal Vit-Fe Fumarate-FA (PREPLUS) 27-1 MG TABS Take 1 tablet by mouth daily. 30 tablet 13 07/16/2019 at Unknown time  .  Accu-Chek Softclix Lancets lancets Use as instructed (Patient not taking: Reported on 07/17/2019) 100 each 12   . Blood Pressure Monitoring (BLOOD PRESSURE CUFF) MISC 1 Device by Does not apply route once a week. (Patient not taking: Reported on 07/17/2019) 1 each 0   . cetirizine (ZYRTEC) 10 MG tablet TAKE ONE TABLET BY MOUTH DAILY (Patient not taking: Reported on 07/17/2019) 30 tablet 10   . docusate sodium (COLACE) 100 MG capsule Take 1 capsule (100 mg total) by mouth 2 (two) times daily as needed. 30 capsule 2   . Elastic Bandages & Supports (COMFORT FIT MATERNITY SUPP SM) MISC Wear as directed. (Patient not taking: Reported on 07/17/2019) 1 each 0   . glucose blood (ACCU-CHEK GUIDE) test strip Use as instructed (Patient not taking: Reported on 07/17/2019) 100 each 12   . phenylephrine-shark liver oil-mineral oil-petrolatum (PREPARATION H) 0.25-3-14-71.9 % rectal ointment Place 1 application rectally 2 (two) times daily as needed for hemorrhoids. 30 g 0    Results for orders placed or performed  during the hospital encounter of 07/17/19 (from the past 48 hour(s))  CBC     Status: None   Collection Time: 07/17/19 12:09 PM  Result Value Ref Range   WBC 9.9 4.0 - 10.5 K/uL   RBC 4.12 3.87 - 5.11 MIL/uL   Hemoglobin 12.1 12.0 - 15.0 g/dL   HCT 30.1 60.1 - 09.3 %   MCV 93.2 80.0 - 100.0 fL   MCH 29.4 26.0 - 34.0 pg   MCHC 31.5 30.0 - 36.0 g/dL   RDW 23.5 57.3 - 22.0 %   Platelets 315 150 - 400 K/uL   nRBC 0.0 0.0 - 0.2 %    Comment: Performed at [facility], [address] [place], [place] [address]  Comprehensive metabolic panel     Status: Abnormal   Collection Time: 07/17/19 12:09 PM  Result Value Ref Range   Sodium 143 135 - 145 mmol/L   Potassium 3.6 3.5 - 5.1 mmol/L   Chloride 106 98 - 111 mmol/L   CO2 27 22 - 32 mmol/L   Glucose, Bld 97 70 - 99 mg/dL    Comment: Glucose reference range applies only to samples taken after fasting for at least 8 hours.   BUN 5 (L) 6 - 20 mg/dL   Creatinine, Ser 0.62 0.44 - 1.00 mg/dL   Calcium 8.9 8.9 - 37.6 mg/dL   Total Protein 6.4 (L) 6.5 - 8.1 g/dL   Albumin 2.9 (L) 3.5 - 5.0 g/dL   AST 44 (H) 15 - 41 U/L   ALT 52 (H) 0 - 44 U/L   Alkaline Phosphatase 97 38 - 126 U/L   Total Bilirubin 0.6 0.3 - 1.2 mg/dL   GFR calc non Af Amer >60 >60 mL/min   GFR calc Af Amer >60 >60 mL/min   Anion gap 10 5 - 15    Comment: Performed at [facility], [address] [place], [place] [address]  Protein / creatinine ratio, urine     Status: Abnormal   Collection Time: 07/17/19  2:00 PM  Result Value Ref Range   Creatinine, Urine 60.78 mg/dL   Total Protein, Urine 11 mg/dL    Comment: NO NORMAL RANGE ESTABLISHED FOR THIS TEST   Protein Creatinine Ratio 0.18 (H) 0.00 - 0.15 mg/mg[Cre]    Comment: Performed at [facility], [address] [place], [place] [address]  Urinalysis, Routine w reflex microscopic  Status: Abnormal   Collection Time: 07/17/19  5:00 PM  Result Value Ref Range   Color, Urine YELLOW YELLOW    APPearance CLEAR CLEAR   Specific Gravity, Urine 1.006 1.005 - 1.030   pH 5.0 5.0 - 8.0   Glucose, UA NEGATIVE NEGATIVE mg/dL   Hgb urine dipstick MODERATE (A) NEGATIVE   Bilirubin Urine NEGATIVE NEGATIVE   Ketones, ur NEGATIVE NEGATIVE mg/dL   Protein, ur NEGATIVE NEGATIVE mg/dL   Nitrite NEGATIVE NEGATIVE   Leukocytes,Ua NEGATIVE NEGATIVE   RBC / HPF 0-5 0 - 5 RBC/hpf   WBC, UA 0-5 0 - 5 WBC/hpf   Bacteria, UA RARE (A) NONE SEEN   Squamous Epithelial / LPF 0-5 0 - 5    Comment: Performed at [facility], [address], [place], [place] [address]     DG Abd 2 Views  Result Date: 07/17/2019 CLINICAL DATA:  Abdominal pain. Postpartum EXAM: ABDOMEN - 2 VIEW COMPARISON:  None. FINDINGS: The bowel gas pattern is nonobstructive. Moderate volume of stool within the colon. There is no evidence of free air. No radio-opaque calculi is seen. Enlarged liver shadow measuring 23 cm in length at the midclavicular line. Mild widening of the pubic symphysis, likely related to postpartum state. IMPRESSION: 1. Nonobstructive bowel gas pattern. 2. Moderate volume of stool within the colon. 3. Hepatomegaly. Electronically Signed   By: Duanne Guess D.O.   On: 07/17/2019 15:42   Review of Systems  Eyes: Negative for photophobia and visual disturbance.  Respiratory: Negative for cough, chest tightness and shortness of breath.   Cardiovascular: Positive for leg swelling. Negative for chest pain.  Gastrointestinal: Negative for abdominal pain.  Neurological: Negative for dizziness and headaches.   Physical Exam   Blood pressure 138/86, pulse 92, temperature 98.4 F (36.9 C), temperature source Oral, resp. rate (!) 27, SpO2 95 %, currently breastfeeding.   Patient Vitals for the past 24 hrs:  BP Temp Temp src Pulse Resp SpO2  07/17/19 1944 130/70 -- -- 79 20 --  07/17/19 1824 -- -- -- -- (!) 23 --  07/17/19 1823 134/62 -- -- 74 -- --  07/17/19 1617 136/69 -- -- 79 -- --  07/17/19 1601 (!)  141/79 -- -- 83 -- --  07/17/19 1547 127/76 -- -- 83 -- --  07/17/19 1502 (!) 141/79 -- -- 73 -- --  07/17/19 1447 136/77 -- -- 78 -- --  07/17/19 1432 138/86 -- -- 92 -- --  07/17/19 1417 114/73 -- -- 76 -- --  07/17/19 1402 125/70 -- -- 75 -- --  07/17/19 1355 137/77 -- -- 77 -- --  07/17/19 1323 122/77 -- -- 76 -- --  07/17/19 1314 120/72 -- -- 78 -- --  07/17/19 1310 -- 98.4 F (36.9 C) Oral -- (!) 27 95 %    Physical Exam  Constitutional: She is oriented to person, place, and time. She appears well-developed and well-nourished. No distress.  HENT:  Head: Normocephalic.  Eyes: Pupils are equal, round, and reactive to light.  Cardiovascular: Normal rate.  Respiratory: Effort normal and breath sounds normal. No respiratory distress. She has no wheezes. She has no rales.  Musculoskeletal:        General: Normal range of motion.     Cervical back: Neck supple.  Neurological: She is alert and oriented to person, place, and time. She has normal reflexes. GCS eye subscore is 4. GCS verbal subscore is 5. GCS motor subscore is 6.  Skin: Skin  is warm, dry and intact. She is not diaphoretic.  3+ pitting edema in bilateral lower extremities.   Psychiatric: She has a normal mood and affect. Her behavior is normal.    MAU Course  Procedures  None  MDM  PIH labs- mildly elevated LFT's, however lower today than in the ED yesterday BP's labile, many WNL Patient without scotoma or HA's.  Edema likely 2/2 fluid overload.  Abdominal xray shows moderate stool- enema given in MAU with large results Lactation to MAU to speak to patient briefly.  Discussed patient with Dr. Harolyn Rutherford: Norvasc 5 mg given PO, lasix 20 mg given in MAU.  Patient feeling much better. [place] for DC home with close f/u in the office.    Assessment and Plan   A:  1. Other constipation   2. Abdominal pain   3. Pre-eclampsia in postpartum period   4. Postpartum edema      P:  Discharge home with strict return  precautions Pre-E w/o severe features Message sent to [facility] for BP check on Monday (office Is closed Thurs & Friday) virtual visit ok Rx: Norvasc, Lasix X 5 days, Kdur, colace.  Discussed use of Miralax Return to MAU if symptoms worsen Increase oral fluid intake    Jahaan Vanwagner, Artist Pais, NP 07/18/2019 11:19 AM

## 2019-07-17 NOTE — Discharge Instructions (Signed)

## 2019-07-17 NOTE — Progress Notes (Signed)
34 yo PPD#4 s/p SVD presenting today for the evaluation of abdominal cramping pain. Patient had uncomplicated delivery on 4/22 and was discharge on 4/23. She reports having a painful bowel movement on 4/26 and has experienced rectal bleeding since. She states her cramps are worst during and after breastfeeding. Patient also reports urinary incontinence. She reports nausea and emesis since Sunday. Patient is breastfeeding and suffering from breast engorgement.  Past Medical History:  Diagnosis Date  . Anxiety   . Blood transfusion without reported diagnosis   . Gestational diabetes   . History of postpartum hemorrhage   . History of sinusitis   . Hypoglycemia   . Obese   . Obesity   . Pregnancy 09/2018   Past Surgical History:  Procedure Laterality Date  . DILATION AND CURETTAGE OF UTERUS  2008   retained products after vaginal delivery  . LEFT OOPHORECTOMY  2012   left ovary removed; cyst related   Family History  Problem Relation Age of Onset  . Asthma Brother   . Diabetes Maternal Grandmother    Social History   Tobacco Use  . Smoking status: Never Smoker  . Smokeless tobacco: Never Used  Substance Use Topics  . Alcohol use: Not Currently  . Drug use: No   ROS See pertinent in HPI Blood pressure (!) 153/91, pulse 86, weight 297 lb 3.2 oz (134.8 kg), last menstrual period 10/11/2018, unknown if currently breastfeeding.  GENERAL: Well-developed, well-nourished female in no acute distress.  ABDOMEN: Soft, nontender, nondistended. No organomegaly. PELVIC: Not performed EXTREMITIES: No cyanosis, clubbing. Bilateral lower extremity edema equal in size, 2+ distal pulses.  A/P 34 yo PPD#4 - reassurance provided regarding abdominal cramping as it is related to involution of uterus - Rx preparation H provided to help with rectal bleeding. Rx colace also provided - Patient with elevated BP and nausea/emesis. Will send to MAU to rule out pp preeclampsia - Discussed options to  decrease breast engorgement. Patient to reach out to lactation consultant

## 2019-07-19 LAB — URINE CULTURE

## 2019-07-23 ENCOUNTER — Telehealth (INDEPENDENT_AMBULATORY_CARE_PROVIDER_SITE_OTHER): Payer: Medicaid Other

## 2019-07-23 ENCOUNTER — Telehealth: Payer: Self-pay | Admitting: *Deleted

## 2019-07-23 VITALS — BP 134/88 | HR 90

## 2019-07-23 DIAGNOSIS — Z0130 Encounter for examination of blood pressure without abnormal findings: Secondary | ICD-10-CM

## 2019-07-23 MED ORDER — NITROFURANTOIN MONOHYD MACRO 100 MG PO CAPS
100.0000 mg | ORAL_CAPSULE | Freq: Two times a day (BID) | ORAL | 1 refills | Status: DC
Start: 2019-07-23 — End: 2019-08-03

## 2019-07-23 NOTE — Progress Notes (Signed)
I connected with  Jennifer Ellison on 07/23/19 by a video enabled telemedicine application and verified that I am speaking with the correct person using two identifiers.   I discussed the limitations of evaluation and management by telemedicine. The patient expressed understanding and agreed to proceed.  Subjective:  Jennifer Ellison is a 34 y.o. female here for Virtual BP check.   Hypertension ROS: taking medications as instructed, no medication side effects noted, no TIA's, no chest pain on exertion, no dyspnea on exertion and no swelling of ankles.    Objective:  BP 134/88   Pulse 90   Appearance alert, well appearing, and in no distress and oriented to person, place, and time. General exam BP noted to be well controlled today in office.    Assessment:   Blood Pressure stable.   Plan:  Current treatment plan is effective, no change in therapy. If sx worsen go to the Hospital for evaluation, Virtual visit in one week per Doctor Clearance Coots.

## 2019-07-23 NOTE — Telephone Encounter (Signed)
Pt informed of results and medication sent in .  

## 2019-07-23 NOTE — Telephone Encounter (Signed)
-----   Message from Catalina Antigua, MD sent at 07/23/2019  8:46 AM EDT ----- Please inform patient of UTI. A prescription was sent to her pharmacy

## 2019-07-23 NOTE — Addendum Note (Signed)
Addended by: Catalina Antigua on: 07/23/2019 08:46 AM   Modules accepted: Orders

## 2019-07-27 NOTE — Progress Notes (Signed)
I reviewed the nurses note and agree with the plan of care.   Norm Wray A, MD 06/17/2017 10:46 AM  

## 2019-07-30 ENCOUNTER — Telehealth (INDEPENDENT_AMBULATORY_CARE_PROVIDER_SITE_OTHER): Payer: Medicaid Other

## 2019-07-30 DIAGNOSIS — Z0130 Encounter for examination of blood pressure without abnormal findings: Secondary | ICD-10-CM | POA: Diagnosis not present

## 2019-07-30 NOTE — Progress Notes (Signed)
I connected with Jennifer Ellison on 07/30/19 at  1:30 PM EDT by a video enabled telemedicine application and verified that I am speaking with the correct person using two identifiers.    Assessment and Plan:  Normal BP 118/76 Pulse 87   Follow Up Instructions:  08/22/19   I discussed the assessment and treatment plan with the patient. The patient was provided an opportunity to ask questions and all were answered. The patient agreed with the plan and demonstrated an understanding of the instructions.   The patient was advised to call back or seek an in-person evaluation if the symptoms worsen or if the condition fails to improve as anticipated.  I provided 5 minutes of non-face-to-face time during this encounter.   Thom Chimes, CMA

## 2019-07-30 NOTE — Progress Notes (Signed)
Patient was assessed and managed by nursing staff during this encounter. I have reviewed the chart and agree with the documentation and plan. I have also made any necessary editorial changes.  Catalina Antigua, MD 07/30/2019 3:36 PM

## 2019-08-03 ENCOUNTER — Inpatient Hospital Stay (HOSPITAL_COMMUNITY)
Admission: AD | Admit: 2019-08-03 | Discharge: 2019-08-03 | Disposition: A | Discharge status: Home / Self Care | Payer: Medicaid Other | Attending: Obstetrics & Gynecology | Admitting: Obstetrics & Gynecology

## 2019-08-03 ENCOUNTER — Other Ambulatory Visit: Payer: Self-pay

## 2019-08-03 ENCOUNTER — Encounter (HOSPITAL_COMMUNITY): Payer: Self-pay | Admitting: Obstetrics & Gynecology

## 2019-08-03 ENCOUNTER — Inpatient Hospital Stay (HOSPITAL_BASED_OUTPATIENT_CLINIC_OR_DEPARTMENT_OTHER): Payer: Medicaid Other

## 2019-08-03 ENCOUNTER — Telehealth: Payer: Self-pay

## 2019-08-03 DIAGNOSIS — M79605 Pain in left leg: Secondary | ICD-10-CM | POA: Diagnosis not present

## 2019-08-03 DIAGNOSIS — Z79899 Other long term (current) drug therapy: Secondary | ICD-10-CM | POA: Diagnosis not present

## 2019-08-03 DIAGNOSIS — M25562 Pain in left knee: Secondary | ICD-10-CM

## 2019-08-03 DIAGNOSIS — M7989 Other specified soft tissue disorders: Secondary | ICD-10-CM

## 2019-08-03 DIAGNOSIS — Z8759 Personal history of other complications of pregnancy, childbirth and the puerperium: Secondary | ICD-10-CM | POA: Diagnosis not present

## 2019-08-03 DIAGNOSIS — Z90721 Acquired absence of ovaries, unilateral: Secondary | ICD-10-CM | POA: Diagnosis not present

## 2019-08-03 DIAGNOSIS — Z791 Long term (current) use of non-steroidal anti-inflammatories (NSAID): Secondary | ICD-10-CM | POA: Insufficient documentation

## 2019-08-03 NOTE — Telephone Encounter (Signed)
Patient states that she has been having swelling and pain in her left knee. She states that her leg has been giving out. She also states that there is a big bulge in that area that was first noticed this past Tuesday. She states that the pain is only when she walks on that leg, but not to touch. She denies having any redness, but some warmth to area. Patient advised to go to MAU for evaluation. Patient verbalized understanding.

## 2019-08-03 NOTE — Progress Notes (Signed)
Lower extremity venous has been completed.   Preliminary results in CV Proc.   Blanch Media 08/03/2019 3:29 PM

## 2019-08-03 NOTE — Discharge Instructions (Signed)
Acute Knee Pain, Adult Acute knee pain is sudden and may be caused by damage, swelling, or irritation of the muscles and tissues that support your knee. The injury may result from:  A fall.  An injury to your knee from twisting motions.  A hit to the knee.  Infection. Acute knee pain may go away on its own with time and rest. If it does not, your health care provider may order tests to find the cause of the pain. These may include:  Imaging tests, such as an X-ray, MRI, or ultrasound.  Joint aspiration. In this test, fluid is removed from the knee.  Arthroscopy. In this test, a lighted tube is inserted into the knee and an image is projected onto a TV screen.  Biopsy. In this test, a sample of tissue is removed from the body and studied under a microscope. Follow these instructions at home: Pay attention to any changes in your symptoms. Take these actions to relieve your pain. If you have a knee sleeve or brace:   Wear the sleeve or brace as told by your health care provider. Remove it only as told by your health care provider.  Loosen the sleeve or brace if your toes tingle, become numb, or turn cold and blue.  Keep the sleeve or brace clean.  If the sleeve or brace is not waterproof: ? Do not let it get wet. ? Cover it with a watertight covering when you take a bath or shower. Activity  Rest your knee.  Do not do things that cause pain or make pain worse.  Avoid high-impact activities or exercises, such as running, jumping rope, or doing jumping jacks.  Work with a physical therapist to make a safe exercise program, as recommended by your health care provider. Do exercises as told by your physical therapist. Managing pain, stiffness, and swelling   If directed, put ice on the knee: ? Put ice in a plastic bag. ? Place a towel between your skin and the bag. ? Leave the ice on for 20 minutes, 2-3 times a day.  If directed, use an elastic bandage to put pressure  (compression) on your injured knee. This may control swelling, give support, and help with discomfort. General instructions  Take over-the-counter and prescription medicines only as told by your health care provider.  Raise (elevate) your knee above the level of your heart when you are sitting or lying down.  Sleep with a pillow under your knee.  Do not use any products that contain nicotine or tobacco, such as cigarettes, e-cigarettes, and chewing tobacco. These can delay healing. If you need help quitting, ask your health care provider.  If you are overweight, work with your health care provider and a dietitian to set a weight-loss goal that is healthy and reasonable for you. Extra weight can put pressure on your knee.  Keep all follow-up visits as told by your health care provider. This is important. Contact a health care provider if:  Your knee pain continues, changes, or gets worse.  You have a fever along with knee pain.  Your knee feels warm to the touch.  Your knee buckles or locks up. Get help right away if:  Your knee swells, and the swelling becomes worse.  You cannot move your knee.  You have severe pain in your knee. Summary  Acute knee pain can be caused by a fall, an injury, an infection, or damage, swelling, or irritation of the tissues that support your knee.    Your health care provider may perform tests to find out the cause of the pain.  Pay attention to any changes in your symptoms. Relieve your pain with rest, medicines, light activity, and use of ice.  Get help if your pain continues or becomes worse, your knee swells, or you cannot move your knee. This information is not intended to replace advice given to you by your health care provider. Make sure you discuss any questions you have with your health care provider. Document Revised: 08/18/2017 Document Reviewed: 08/18/2017 Elsevier Patient Education  2020 [employer]  

## 2019-08-03 NOTE — MAU Note (Signed)
Noted a soft lump behind left knee on Sunday. Pain was so bad on Tues that she couldn't walk. Leg gave out on her yesterday gave out on her and she hit her head on the counter.  Called dr this morning, they told her to come in.  Not tender to touch.  Hurts with activity.  Vag del 4/21, readmit and MG post partem. Still on BP meds.

## 2019-08-03 NOTE — MAU Provider Note (Addendum)
History    Patient presents with 6 days of left anterior leg pain that extends from the inferior thigh to the mid-calf, and a new non-tender bump on the posterior knee first noticed 3 days ago. She presents in the MAU today after her leg "gave out" yesterday when she was standing, which caused her to fall and was followed by excruciating pain. She states that the pain is sporadic, worse with prolonged standing or movement, and that she is unable to flex her left leg; the pain is relieved with rest. She denies any trauma or recent strenuous activity but does say she heard a "popping" noise when shifting her leg to get into bed on Tuesday (however, pain started prior to this). There is no erythema, swelling, or edema of the legs or feet and she denies any numbness or tingling. She denies cough, fever, chest pain, or SOB. She endorses continued difficulty breathing while flat, which began late in her pregnancy.    Patient also reports new onset painless rectal bleeding for 2 weeks. She states that there is a dark red blood that streams into the toilet when she has a BM. She denies abdominal pain and did not have hemorrhoids on her visit 5/27. She also reports new headaches since giving birth; these are relieved with Tylenol, but are unusual for the patient.   CSN: [id number]  Arrival date and time: 08/03/19 1131    Chief Complaint  Patient presents with   Leg Pain   mass on leg    OB History as of 07/11/2019     Gravida  5   Para  2   Term  2   Preterm      AB  2   Living  2      SAB  2   TAB      Ectopic      Multiple      Live Births  2           Past Medical History:  Diagnosis Date   Anxiety    Blood transfusion without reported diagnosis    Gestational diabetes    History of postpartum hemorrhage    History of sinusitis    Hypoglycemia    Obesity    Preeclampsia in postpartum period 07/17/2019    Past Surgical History:  Procedure Laterality Date    DILATION AND CURETTAGE OF UTERUS  2008   retained products after vaginal delivery   LEFT OOPHORECTOMY  2012   left ovary removed; cyst related    Family History  Problem Relation Age of Onset   Asthma Brother    Diabetes Maternal Grandmother     Social History   Tobacco Use   Smoking status: Never Smoker   Smokeless tobacco: Never Used  Substance Use Topics   Alcohol use: Not Currently   Drug use: No    Allergies:  Allergies  Allergen Reactions   Blueberry Flavor Swelling    No medications prior to admission.   Finished potassium, lasix, and macrobid. No longer taking ibuprofen d/t GI upset. Plans to stop taking colace. Still taking amlodipine.    Review of Systems  Constitutional: Negative for fever.  HENT: Negative.   Respiratory: Negative for cough and shortness of breath.   Cardiovascular: Negative for chest pain.  Gastrointestinal: Positive for anal bleeding. Negative for constipation.  Genitourinary: Negative for difficulty urinating and vaginal bleeding.  Musculoskeletal: Positive for joint swelling (bump on back of knee).  Skin: Negative.  Neurological: Positive for headaches. Negative for dizziness and numbness.   Physical Exam   Blood pressure 124/83, pulse 81, temperature 98.3 F (36.8 C), temperature source Oral, resp. rate 18, last menstrual period 10/11/2018, SpO2 98 %, currently breastfeeding.  Physical Exam  Constitutional: She is oriented to person, place, and time. She appears well-developed and well-nourished.  HENT:  Head: Normocephalic and atraumatic.  Eyes: Conjunctivae and EOM are normal.  Cardiovascular: Normal rate, regular rhythm and normal heart sounds.  Respiratory: Effort normal and breath sounds normal.  GI: Soft.  Musculoskeletal:        General: No edema.     Cervical back: Normal range of motion and neck supple.  Neurological: She is alert and oriented to person, place, and time.  Skin: Skin is dry.    MAU Course    Orders Placed This Encounter  Procedures   Discharge patient   Fall precautions   VAS [place] LOWER EXTREMITY VENOUS (DVT)   No results found for this or any previous visit (from the past 24 hour(s)).  VAS [place] LOWER EXTREMITY VENOUS (DVT)  Result Date: 08/03/2019  Lower Venous DVTStudy Indications: Swelling, and Edema.  Comparison Study: no prior Performing Technologist: Blanch Media RVS  Examination Guidelines: A complete evaluation includes B-mode imaging, spectral Doppler, color Doppler, and power Doppler as needed of all accessible portions of each vessel. Bilateral testing is considered an integral part of a complete examination. Limited examinations for reoccurring indications may be performed as noted. The reflux portion of the exam is performed with the patient in reverse Trendelenburg.  +-----+---------------+---------+-----------+----------+--------------+ RIGHTCompressibilityPhasicitySpontaneityPropertiesThrombus Aging +-----+---------------+---------+-----------+----------+--------------+ CFV  Full           Yes      Yes                                 +-----+---------------+---------+-----------+----------+--------------+   +---------+---------------+---------+-----------+----------+--------------+ LEFT     CompressibilityPhasicitySpontaneityPropertiesThrombus Aging +---------+---------------+---------+-----------+----------+--------------+ CFV      Full           Yes      Yes                                 +---------+---------------+---------+-----------+----------+--------------+ SFJ      Full                                                        +---------+---------------+---------+-----------+----------+--------------+ FV Prox  Full                                                        +---------+---------------+---------+-----------+----------+--------------+ FV Mid   Full                                                         +---------+---------------+---------+-----------+----------+--------------+ FV DistalFull                                                        +---------+---------------+---------+-----------+----------+--------------+  PFV      Full                                                        +---------+---------------+---------+-----------+----------+--------------+ POP      Full           Yes      Yes                                 +---------+---------------+---------+-----------+----------+--------------+ PTV      Full                                                        +---------+---------------+---------+-----------+----------+--------------+ PERO     Full                                                        +---------+---------------+---------+-----------+----------+--------------+     Summary: RIGHT: - No evidence of common femoral vein obstruction.  LEFT: - There is no evidence of deep vein thrombosis in the lower extremity.  - No cystic structure found in the popliteal fossa.  *See table(s) above for measurements and observations.    Preliminary     Assessment and Plan   Patient presents with 6 days of left anterior leg pain that extends from above the knee to the mid-calf and a new non-tender bump on the posterior knee first noticed Tuesday. There is no erythema, swelling, or edema of the legs and she denies cough, fever, chest pain, and SOB. No cystic structure or mass was felt on manual physical exam of the knee while patient was supine. VAS Ultrasound showed no evidence of DVT and no cystic structure in the popliteal fossa. Given these negative findings, we feel her symptoms are more consistent with an MSK issue, possible Baker's cyst, or adipose tissue redistribution.   Plan discharge and have patient follow-up with OP provider.    Alexa Holloway 08/03/2019, 5:41 PM     I confirm that I have verified and agree with the information documented in the  medical student's note.   Please see my note for full documentation of this encounter.  Jorje Guild, NP 08/03/2019 5:57 PM

## 2019-08-03 NOTE — MAU Provider Note (Signed)
Chief Complaint: Leg Pain and mass on leg   First Provider Initiated Contact with Patient 08/03/19 1247     SUBJECTIVE HPI: Jennifer Ellison is a 34 y.o. [id number] at 3 weeks post partum who presents to [facility] reporting left leg pain. Current symptoms started 6 days ago. Reports pain her left knee that at times makes her knee "give out". Also has felt a lump on the back of the same knee. Called her ob/gyn who instructed her to come to MAU for evaluation. Denies injury, cough, chest pain, SOB, or fever. Has had bilateral lower extremity edema since delivery which she took a course of lasix for.   Location: left knee Quality: sharp Severity: 5 (currently no pain)/10 on pain scale Duration: 6 days Timing: intermittent Modifying factors: occurs with prolonged standing & walking Associated signs and symptoms: none  Past Medical History:  Diagnosis Date  . Anxiety   . Blood transfusion without reported diagnosis   . Gestational diabetes   . History of postpartum hemorrhage   . History of sinusitis   . Hypoglycemia   . Obesity   . Preeclampsia in postpartum period 07/17/2019   OB History  Gravida Para Term Preterm AB Living  5 3 3   2 3   SAB TAB Ectopic Multiple Live Births  2     0 3    # Outcome Date GA Lbr Len/2nd Weight Sex Delivery Anes PTL Lv  5 Term 07/11/19 [redacted]w[redacted]d / 01:18 3070 g F Vag-Spont EPI  LIV  4 Term 06/20/06   3175 g M Vag-Spont   LIV     Birth Comments: [place]  3 SAB 2008          2 Term 04/20/02   3062 g M Vag-Spont   LIV  1 SAB 2003           Past Surgical History:  Procedure Laterality Date  . DILATION AND CURETTAGE OF UTERUS  2008   retained products after vaginal delivery  . LEFT OOPHORECTOMY  2012   left ovary removed; cyst related   Social History   Socioeconomic History  . Marital status: Single    Spouse name: Not on file  . Number of children: Not on file  . Years of education: Not on file  . Highest education level: Not on file   Occupational History  . Not on file  Tobacco Use  . Smoking status: Never Smoker  . Smokeless tobacco: Never Used  Substance and Sexual Activity  . Alcohol use: Not Currently  . Drug use: No  . Sexual activity: Not Currently  Other Topics Concern  . Not on file  Social History Narrative  . Not on file   Social Determinants of Health   Financial Resource Strain:   . Difficulty of Paying Living Expenses:   Food Insecurity:   . Worried About Charity fundraiser in the Last Year:   . Arboriculturist in the Last Year:   Transportation Needs:   . Film/video editor (Medical):   Marland Kitchen Lack of Transportation (Non-Medical):   Physical Activity:   . Days of Exercise per Week:   . Minutes of Exercise per Session:   Stress:   . Feeling of Stress :   Social Connections:   . Frequency of Communication with Friends and Family:   . Frequency of Social Gatherings with Friends and Family:   . Attends Religious Services:   . Active Member of Clubs or Organizations:   .  Attends Banker Meetings:   Marland Kitchen Marital Status:   Intimate Partner Violence:   . Fear of Current or Ex-Partner:   . Emotionally Abused:   Marland Kitchen Physically Abused:   . Sexually Abused:    Family History  Problem Relation Age of Onset  . Asthma Brother   . Diabetes Maternal Grandmother    No current facility-administered medications on file prior to encounter.   Current Outpatient Medications on File Prior to Encounter  Medication Sig Dispense Refill  . amLODipine (NORVASC) 5 MG tablet Take 1 tablet (5 mg total) by mouth daily. 30 tablet 1  . docusate sodium (COLACE) 100 MG capsule Take 2 capsules (200 mg total) by mouth 2 (two) times daily as needed. 30 capsule 2  . phenylephrine-shark liver oil-mineral oil-petrolatum (PREPARATION H) 0.25-3-14-71.9 % rectal ointment Place 1 application rectally 2 (two) times daily as needed for hemorrhoids. 30 g 0  . Prenatal Vit-Fe Fumarate-FA (PREPLUS) 27-1 MG TABS Take 1  tablet by mouth daily. 30 tablet 13  . ibuprofen (ADVIL) 600 MG tablet Take 1 tablet (600 mg total) by mouth every 6 (six) hours. 30 tablet 0   Allergies  Allergen Reactions  . Blueberry Flavor Swelling    I have reviewed patient's Past Medical Hx, Surgical Hx, Family Hx, Social Hx, medications and allergies.   Review of Systems  Constitutional: Negative.   Respiratory: Negative for cough and shortness of breath.   Cardiovascular: Positive for leg swelling. Negative for chest pain.  Musculoskeletal:       +Left leg pain    OBJECTIVE Patient Vitals for the past 24 hrs:  BP Temp Temp src Pulse Resp SpO2  08/03/19 1500 124/83 -- -- 81 18 --  08/03/19 1228 129/77 -- -- 90 -- --  08/03/19 1222 (!) 150/86 98.3 F (36.8 C) Oral 87 17 --  08/03/19 1156 129/72 98.9 F (37.2 C) Oral 93 18 98 %   Constitutional: Well-developed, well-nourished female in no acute distress.  Cardiovascular: normal rate & rhythm, no murmur Respiratory: normal rate and effort. Lung sounds clear throughout GI: Abd soft, non-tender, Pos BS x 4. No guarding or rebound tenderness MS: Bilateral calves equal in size = 52 cm. No redness or tenderness. Negative homans sign Neurologic: Alert and oriented x 4.    LAB RESULTS No results found for this or any previous visit (from the past 24 hour(s)).  IMAGING VAS [place] LOWER EXTREMITY VENOUS (DVT)  Result Date: 08/03/2019  Lower Venous DVTStudy Indications: Swelling, and Edema.  Comparison Study: no prior Performing Technologist: Blanch Media RVS  Examination Guidelines: A complete evaluation includes B-mode imaging, spectral Doppler, color Doppler, and power Doppler as needed of all accessible portions of each vessel. Bilateral testing is considered an integral part of a complete examination. Limited examinations for reoccurring indications may be performed as noted. The reflux portion of the exam is performed with the patient in reverse Trendelenburg.   +-----+---------------+---------+-----------+----------+--------------+ RIGHTCompressibilityPhasicitySpontaneityPropertiesThrombus Aging +-----+---------------+---------+-----------+----------+--------------+ CFV  Full           Yes      Yes                                 +-----+---------------+---------+-----------+----------+--------------+   +---------+---------------+---------+-----------+----------+--------------+ LEFT     CompressibilityPhasicitySpontaneityPropertiesThrombus Aging +---------+---------------+---------+-----------+----------+--------------+ CFV      Full           Yes      Yes                                 +---------+---------------+---------+-----------+----------+--------------+  SFJ      Full                                                        +---------+---------------+---------+-----------+----------+--------------+ FV Prox  Full                                                        +---------+---------------+---------+-----------+----------+--------------+ FV Mid   Full                                                        +---------+---------------+---------+-----------+----------+--------------+ FV DistalFull                                                        +---------+---------------+---------+-----------+----------+--------------+ PFV      Full                                                        +---------+---------------+---------+-----------+----------+--------------+ POP      Full           Yes      Yes                                 +---------+---------------+---------+-----------+----------+--------------+ PTV      Full                                                        +---------+---------------+---------+-----------+----------+--------------+ PERO     Full                                                         +---------+---------------+---------+-----------+----------+--------------+     Summary: RIGHT: - No evidence of common femoral vein obstruction.  LEFT: - There is no evidence of deep vein thrombosis in the lower extremity.  - No cystic structure found in the popliteal fossa.  *See table(s) above for measurements and observations.    Preliminary     MAU COURSE Orders Placed This Encounter  Procedures  . Discharge patient  . Fall precautions  . VAS [place] LOWER EXTREMITY VENOUS (DVT)   No orders of the defined types were placed in this encounter.   MDM BLE equal in size. No erythema or tenderness. Back of knees feels comparable with exception to some  variation of adipose tissue.   Venous doppler study performed, no evidence of DVT.   Description of pain most likely MSK/ortho in nature. Instructed patient to f/u with PCP or urgent care.   ASSESSMENT 1. Acute pain of left knee     PLAN Discharge home in stable condition.    Allergies as of 08/03/2019      Reactions   Blueberry Flavor Swelling      Medication List    STOP taking these medications   Accu-Chek Guide test strip Generic drug: glucose blood   Accu-Chek Softclix Lancets lancets   Blood Pressure Cuff Misc   cetirizine 10 MG tablet Commonly known as: Geophysicist/field seismologist Maternity Supp Sm Misc   furosemide 20 MG tablet Commonly known as: Lasix   nitrofurantoin (macrocrystal-monohydrate) 100 MG capsule Commonly known as: MACROBID   Potassium Chloride ER 20 MEQ Tbcr     TAKE these medications   amLODipine 5 MG tablet Commonly known as: NORVASC Take 1 tablet (5 mg total) by mouth daily.   docusate sodium 100 MG capsule Commonly known as: COLACE Take 2 capsules (200 mg total) by mouth 2 (two) times daily as needed.   ibuprofen 600 MG tablet Commonly known as: ADVIL Take 1 tablet (600 mg total) by mouth every 6 (six) hours.   phenylephrine-shark liver oil-mineral oil-petrolatum 0.25-3-14-71.9 % rectal  ointment Commonly known as: PREPARATION H Place 1 application rectally 2 (two) times daily as needed for hemorrhoids.   PrePLUS 27-1 MG Tabs Take 1 tablet by mouth daily.        Judeth Horn, NP 08/03/2019  4:03 PM

## 2019-08-22 ENCOUNTER — Ambulatory Visit: Payer: Medicaid Other | Admitting: Obstetrics and Gynecology

## 2019-08-22 ENCOUNTER — Other Ambulatory Visit: Payer: Medicaid Other

## 2019-09-19 ENCOUNTER — Other Ambulatory Visit: Payer: Medicaid Other

## 2019-09-19 ENCOUNTER — Other Ambulatory Visit: Payer: Self-pay

## 2019-09-19 ENCOUNTER — Ambulatory Visit (INDEPENDENT_AMBULATORY_CARE_PROVIDER_SITE_OTHER): Payer: Medicaid Other | Admitting: Obstetrics & Gynecology

## 2019-09-19 ENCOUNTER — Encounter: Payer: Self-pay | Admitting: Obstetrics & Gynecology

## 2019-09-19 DIAGNOSIS — Z8632 Personal history of gestational diabetes: Secondary | ICD-10-CM

## 2019-09-19 DIAGNOSIS — Z1389 Encounter for screening for other disorder: Secondary | ICD-10-CM

## 2019-09-19 DIAGNOSIS — Z975 Presence of (intrauterine) contraceptive device: Secondary | ICD-10-CM

## 2019-09-19 DIAGNOSIS — R030 Elevated blood-pressure reading, without diagnosis of hypertension: Secondary | ICD-10-CM

## 2019-09-19 MED ORDER — AMLODIPINE BESYLATE 5 MG PO TABS: 5.0000 mg | ORAL_TABLET | Freq: Every day | ORAL | 1 refills | Status: AC

## 2019-09-19 NOTE — Patient Instructions (Signed)
Hypertension, Adult Hypertension is another name for high blood pressure. High blood pressure forces your heart to work harder to pump blood. This can cause problems over time. There are two numbers in a blood pressure reading. There is a top number (systolic) over a bottom number (diastolic). It is best to have a blood pressure that is below 120/80. Healthy choices can help lower your blood pressure, or you may need medicine to help lower it. What are the causes? The cause of this condition is not known. Some conditions may be related to high blood pressure. What increases the risk?  Smoking.  Having type 2 diabetes mellitus, high cholesterol, or both.  Not getting enough exercise or physical activity.  Being overweight.  Having too much fat, sugar, calories, or salt (sodium) in your diet.  Drinking too much alcohol.  Having long-term (chronic) kidney disease.  Having a family history of high blood pressure.  Age. Risk increases with age.  Race. You may be at higher risk if you are African American.  Gender. Men are at higher risk than women before age 45. After age 65, women are at higher risk than men.  Having obstructive sleep apnea.  Stress. What are the signs or symptoms?  High blood pressure may not cause symptoms. Very high blood pressure (hypertensive crisis) may cause: ? Headache. ? Feelings of worry or nervousness (anxiety). ? Shortness of breath. ? Nosebleed. ? A feeling of being sick to your stomach (nausea). ? Throwing up (vomiting). ? Changes in how you see. ? Very bad chest pain. ? Seizures. How is this treated?  This condition is treated by making healthy lifestyle changes, such as: ? Eating healthy foods. ? Exercising more. ? Drinking less alcohol.  Your health care provider may prescribe medicine if lifestyle changes are not enough to get your blood pressure under control, and if: ? Your top number is above 130. ? Your bottom number is above  80.  Your personal target blood pressure may vary. Follow these instructions at home: Eating and drinking   If told, follow the DASH eating plan. To follow this plan: ? Fill one half of your plate at each meal with fruits and vegetables. ? Fill one fourth of your plate at each meal with whole grains. Whole grains include whole-wheat pasta, brown rice, and whole-grain bread. ? Eat or drink low-fat dairy products, such as skim milk or low-fat yogurt. ? Fill one fourth of your plate at each meal with low-fat (lean) proteins. Low-fat proteins include fish, chicken without skin, eggs, beans, and tofu. ? Avoid fatty meat, cured and processed meat, or chicken with skin. ? Avoid pre-made or processed food.  Eat less than 1,500 mg of salt each day.  Do not drink alcohol if: ? Your doctor tells you not to drink. ? You are pregnant, may be pregnant, or are planning to become pregnant.  If you drink alcohol: ? Limit how much you use to:  0-1 drink a day for women.  0-2 drinks a day for men. ? Be aware of how much alcohol is in your drink. In the [place] one drink equals one 12 oz bottle of beer (355 mL), one 5 oz glass of wine (148 mL), or one 1 oz glass of hard liquor (44 mL). Lifestyle   Work with your doctor to stay at a healthy weight or to lose weight. Ask your doctor what the best weight is for you.  Get at least 30 minutes of exercise most   days of the week. This may include walking, swimming, or biking.  Get at least 30 minutes of exercise that strengthens your muscles (resistance exercise) at least 3 days a week. This may include lifting weights or doing Pilates.  Do not use any products that contain nicotine or tobacco, such as cigarettes, e-cigarettes, and chewing tobacco. If you need help quitting, ask your doctor.  Check your blood pressure at home as told by your doctor.  Keep all follow-up visits as told by your doctor. This is important. Medicines  Take over-the-counter  and prescription medicines only as told by your doctor. Follow directions carefully.  Do not skip doses of blood pressure medicine. The medicine does not work as well if you skip doses. Skipping doses also puts you at risk for problems.  Ask your doctor about side effects or reactions to medicines that you should watch for. Contact a doctor if you:  Think you are having a reaction to the medicine you are taking.  Have headaches that keep coming back (recurring).  Feel dizzy.  Have swelling in your ankles.  Have trouble with your vision. Get help right away if you:  Get a very bad headache.  Start to feel mixed up (confused).  Feel weak or numb.  Feel faint.  Have very bad pain in your: ? Chest. ? Belly (abdomen).  Throw up more than once.  Have trouble breathing. Summary  Hypertension is another name for high blood pressure.  High blood pressure forces your heart to work harder to pump blood.  For most people, a normal blood pressure is less than 120/80.  Making healthy choices can help lower blood pressure. If your blood pressure does not get lower with healthy choices, you may need to take medicine. This information is not intended to replace advice given to you by your health care provider. Make sure you discuss any questions you have with your health care provider. Document Revised: 11/16/2017 Document Reviewed: 11/16/2017 Elsevier Patient Education  2020 [employer]. Levonorgestrel intrauterine device (IUD) What is this medicine? LEVONORGESTREL IUD (LEE voe nor jes trel) is a contraceptive (birth control) device. The device is placed inside the uterus by a healthcare professional. It is used to prevent pregnancy. This device can also be used to treat heavy bleeding that occurs during your period. This medicine may be used for other purposes; ask your health care provider or pharmacist if you have questions. COMMON BRAND NAME(S): Cameron Ali What should I tell my health care provider before I take this medicine? They need to know if you have any of these conditions:  abnormal Pap smear  cancer of the breast, uterus, or cervix  diabetes  endometritis  genital or pelvic infection now or in the past  have more than one sexual partner or your partner has more than one partner  heart disease  history of an ectopic or tubal pregnancy  immune system problems  IUD in place  liver disease or tumor  problems with blood clots or take blood-thinners  seizures  use intravenous drugs  uterus of unusual shape  vaginal bleeding that has not been explained  an unusual or allergic reaction to levonorgestrel, other hormones, silicone, or polyethylene, medicines, foods, dyes, or preservatives  pregnant or trying to get pregnant  breast-feeding How should I use this medicine? This device is placed inside the uterus by a health care professional. Talk to your pediatrician regarding the use of this medicine in children. Special care  may be needed. Overdosage: If you think you have taken too much of this medicine contact a poison control center or emergency room at once. NOTE: This medicine is only for you. Do not share this medicine with others. What if I miss a dose? This does not apply. Depending on the brand of device you have inserted, the device will need to be replaced every 3 to 6 years if you wish to continue using this type of birth control. What may interact with this medicine? Do not take this medicine with any of the following medications:  amprenavir  bosentan  fosamprenavir This medicine may also interact with the following medications:  aprepitant  armodafinil  barbiturate medicines for inducing sleep or treating seizures  bexarotene  boceprevir  griseofulvin  medicines to treat seizures like carbamazepine, ethotoin, felbamate, oxcarbazepine, phenytoin,  topiramate  modafinil  pioglitazone  rifabutin  rifampin  rifapentine  some medicines to treat HIV infection like atazanavir, efavirenz, indinavir, lopinavir, nelfinavir, tipranavir, ritonavir  St. John's wort  warfarin This list may not describe all possible interactions. Give your health care provider a list of all the medicines, herbs, non-prescription drugs, or dietary supplements you use. Also tell them if you smoke, drink alcohol, or use illegal drugs. Some items may interact with your medicine. What should I watch for while using this medicine? Visit your doctor or health care professional for regular check ups. See your doctor if you or your partner has sexual contact with others, becomes HIV positive, or gets a sexual transmitted disease. This product does not protect you against HIV infection (AIDS) or other sexually transmitted diseases. You can check the placement of the IUD yourself by reaching up to the top of your vagina with clean fingers to feel the threads. Do not pull on the threads. It is a good habit to check placement after each menstrual period. Call your doctor right away if you feel more of the IUD than just the threads or if you cannot feel the threads at all. The IUD may come out by itself. You may become pregnant if the device comes out. If you notice that the IUD has come out use a backup birth control method like condoms and call your health care provider. Using tampons will not change the position of the IUD and are okay to use during your period. This IUD can be safely scanned with magnetic resonance imaging (MRI) only under specific conditions. Before you have an MRI, tell your healthcare provider that you have an IUD in place, and which type of IUD you have in place. What side effects may I notice from receiving this medicine? Side effects that you should report to your doctor or health care professional as soon as possible:  allergic reactions like skin  rash, itching or hives, swelling of the face, lips, or tongue  fever, flu-like symptoms  genital sores  high blood pressure  no menstrual period for 6 weeks during use  pain, swelling, warmth in the leg  pelvic pain or tenderness  severe or sudden headache  signs of pregnancy  stomach cramping  sudden shortness of breath  trouble with balance, talking, or walking  unusual vaginal bleeding, discharge  yellowing of the eyes or skin Side effects that usually do not require medical attention (report to your doctor or health care professional if they continue or are bothersome):  acne  breast pain  change in sex drive or performance  changes in weight  cramping, dizziness, or faintness  while the device is being inserted  headache  irregular menstrual bleeding within first 3 to 6 months of use  nausea This list may not describe all possible side effects. Call your doctor for medical advice about side effects. You may report side effects to FDA at 1-800-FDA-1088. Where should I keep my medicine? This does not apply. NOTE: This sheet is a summary. It may not cover all possible information. If you have questions about this medicine, talk to your doctor, pharmacist, or health care provider.  2020 Elsevier/Gold Standard (2018-01-17 13:22:01)

## 2019-09-19 NOTE — Progress Notes (Signed)
    Post Partum Visit Note  Jennifer Ellison is a 34 y.o. [phone] female who presents for a postpartum visit. She is 8 weeks postpartum following a normal spontaneous vaginal delivery.  I have fully reviewed the prenatal and intrapartum course. The delivery was at 39.1 gestational weeks.  Anesthesia: epidural. Postpartum course has been complicated by preeclampsia, faeces in intestines, rectal bleeding. Baby is doing wellunremarkable. Baby is feeding by breast. Bleeding no bleeding. Bowel function is normal. Bladder function is normal. Patient is not sexually active. Contraception method is IUD. Postpartum depression screening: negative=0  The following portions of the patient's history were reviewed and updated as appropriate: allergies, current medications, past family history, past medical history, past social history, past surgical history and problem list.  Review of Systems Pertinent items are noted in HPI.    Objective:  Blood pressure (!) 160/97, pulse 70, height 5' 2.25" (1.581 m), weight 278 lb (126.1 kg), currently breastfeeding.  General:  alert, cooperative and no distress     Lungs: normal effort  Heart:  regular rate and rhythm, S1, S2 normal, no murmur, click, rub or gallop  Abdomen: soft, non-tender; bowel sounds normal; no masses,  no organomegaly   Vulva:  not evaluated  Vagina: not evaluated                    Assessment:    normal postpartum exam. Pap smear not done at today's visit.   Plan:   Adam Phenix, MD [facility], [facility]

## 2019-09-20 LAB — GLUCOSE TOLERANCE, 2 HOURS
Glucose, 2 hour: 94 mg/dL (ref 65–139)
Glucose, GTT - Fasting: 81 mg/dL (ref 65–99)

## 2019-10-22 ENCOUNTER — Other Ambulatory Visit: Payer: Self-pay | Admitting: Orthopedic Surgery

## 2019-10-22 DIAGNOSIS — M25462 Effusion, left knee: Secondary | ICD-10-CM

## 2019-10-29 ENCOUNTER — Other Ambulatory Visit: Payer: Medicaid Other

## 2019-11-17 ENCOUNTER — Ambulatory Visit
Admission: RE | Admit: 2019-11-17 | Discharge: 2019-11-17 | Disposition: A | Discharge status: Home / Self Care | Payer: Medicaid Other | Source: Ambulatory Visit | Attending: Orthopedic Surgery | Admitting: Orthopedic Surgery

## 2019-11-17 ENCOUNTER — Other Ambulatory Visit: Payer: Self-pay

## 2019-11-17 DIAGNOSIS — M25562 Pain in left knee: Secondary | ICD-10-CM

## 2020-09-08 ENCOUNTER — Encounter (HOSPITAL_COMMUNITY): Payer: Self-pay

## 2020-09-08 ENCOUNTER — Emergency Department (HOSPITAL_COMMUNITY): Payer: BC Managed Care – PPO

## 2020-09-08 ENCOUNTER — Emergency Department (HOSPITAL_COMMUNITY)
Admission: EM | Admit: 2020-09-08 | Discharge: 2020-09-08 | Disposition: A | Discharge status: Home / Self Care | Payer: BC Managed Care – PPO | Attending: Emergency Medicine | Admitting: Emergency Medicine

## 2020-09-08 DIAGNOSIS — Z5321 Procedure and treatment not carried out due to patient leaving prior to being seen by health care provider: Secondary | ICD-10-CM | POA: Insufficient documentation

## 2020-09-08 DIAGNOSIS — R0789 Other chest pain: Secondary | ICD-10-CM | POA: Insufficient documentation

## 2020-09-08 LAB — BASIC METABOLIC PANEL
Anion gap: 8 (ref 5–15)
BUN: 11 mg/dL (ref 6–20)
CO2: 29 mmol/L (ref 22–32)
Calcium: 9 mg/dL (ref 8.9–10.3)
Chloride: 102 mmol/L (ref 98–111)
Creatinine, Ser: 0.74 mg/dL (ref 0.44–1.00)
GFR, Estimated: >60 mL/min (ref >60)
Glucose, Bld: 100 mg/dL — ABNORMAL HIGH (ref 70–99)
Potassium: 3.4 mmol/L — ABNORMAL LOW (ref 3.5–5.1)
Sodium: 139 mmol/L (ref 135–145)

## 2020-09-08 LAB — CBC
HCT: 46 % (ref 36.0–46.0)
Hemoglobin: 14.8 g/dL (ref 12.0–15.0)
MCH: 29.5 pg (ref 26.0–34.0)
MCHC: 32.2 g/dL (ref 30.0–36.0)
MCV: 91.8 fL (ref 80.0–100.0)
Platelets: 364 10*3/uL (ref 150–400)
RBC: 5.01 MIL/uL (ref 3.87–5.11)
RDW: 12.8 % (ref 11.5–15.5)
WBC: 9 10*3/uL (ref 4.0–10.5)
nRBC: 0 % (ref 0.0–0.2)

## 2020-09-08 LAB — I-STAT BETA HCG BLOOD, ED (MC, WL, AP ONLY): I-stat hCG, quantitative: <5 m[IU]/mL (ref <5)

## 2020-09-08 LAB — TROPONIN I (HIGH SENSITIVITY): Troponin I (High Sensitivity): <2 ng/L (ref <18)

## 2020-09-08 NOTE — ED Provider Notes (Signed)
Emergency Medicine Provider Triage Evaluation Note  Jennifer Ellison , a 35 y.o. female  was evaluated in triage.  Pt complains of chest pain.  Chest pain started last Wednesday.  Patient reports that chest pain was originally midsternal and radiated to her back.  Pain has been intermittent since then.  Patient reports that pain is worse when she takes a deep breath, moves, or lays flat in bed.  Patient states that yesterday at approximately 1850 chest pain moved to the left side of her chest and has been constant since then.  Patient denies any associated nausea, vomiting, shortness of breath or diaphoresis.  Patient states that she recently started sleeping on a new mattress.  Patient denies any history of DVT or PE, hemoptysis, unilateral leg swelling or tenderness, cancer treatment, surgery or traumatic injury or loss of legs.  Patient does have IUD in place.  Review of Systems  Positive: Chest pain Negative: Abdominal pain, nausea, vomiting, shortness of breath, palpitations, leg swelling  Physical Exam  BP (!) 187/112 (BP Location: Left Arm)   Pulse 73   Temp 98.1 F (36.7 C) (Oral)   Resp 18   SpO2 97%  Gen:   Awake, no distress   Resp:  Normal effort, lungs clear to auscultation bilaterally MSK:   Moves extremities without difficulty  Other:  S1 and S2 present with no murmurs rubs or gallops.  No tenderness to chest wall.  Chest pain is reproduced with deep inhalation.  No swelling or tenderness to bilateral lower extremities.  Medical Decision Making  Medically screening exam initiated at 4:02 PM.  Appropriate orders placed.  Despina Pole was informed that the remainder of the evaluation will be completed by another provider, this initial triage assessment does not replace that evaluation, and the importance of remaining in the ED until their evaluation is complete.  The patient appears stable so that the remainder of the work up may be completed by another  provider.      Berneice Heinrich 09/08/20 1604    Gerhard Munch, MD 09/09/20 [phone]

## 2020-09-08 NOTE — ED Notes (Signed)
Pt states that she saw all of her results in mychart and is going to leave. Pt encouraged to stay and encouraged to return at any time.

## 2020-09-08 NOTE — ED Triage Notes (Signed)
Patient arrived via POV.   C/o Chest pain that started last Wednesday.   Central mid chest pain that follows through to the back.   Patient reports pain is now radiating to left chest.   4/10 constant pain and more pain when lying flat.   A/ox4 Ambulatory in triage

## 2021-09-14 IMAGING — US US MFM OB FOLLOW-UP
1 series · 13 of 28 positions shown · non-contrast
Comparison: none

[Series 1: us mfm ob follow-up · 13 of 46 slices shown]
[im 2/46]
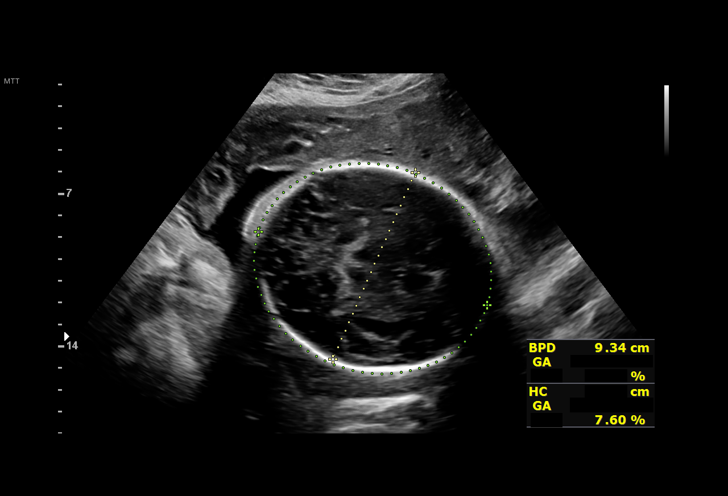
[im 6/46]
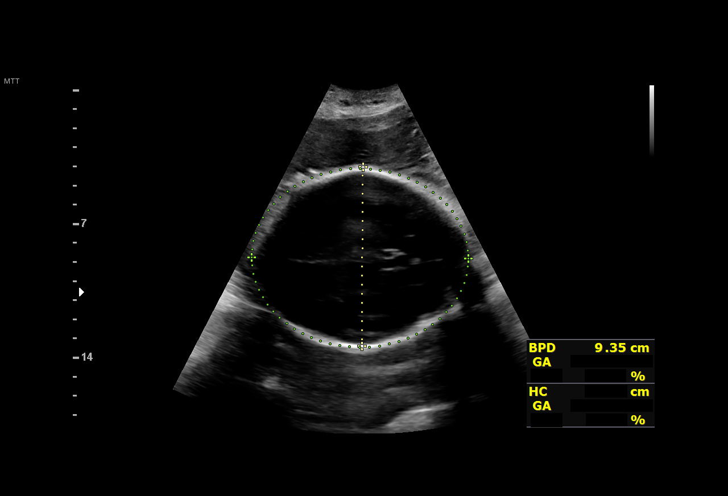
[im 9/46]
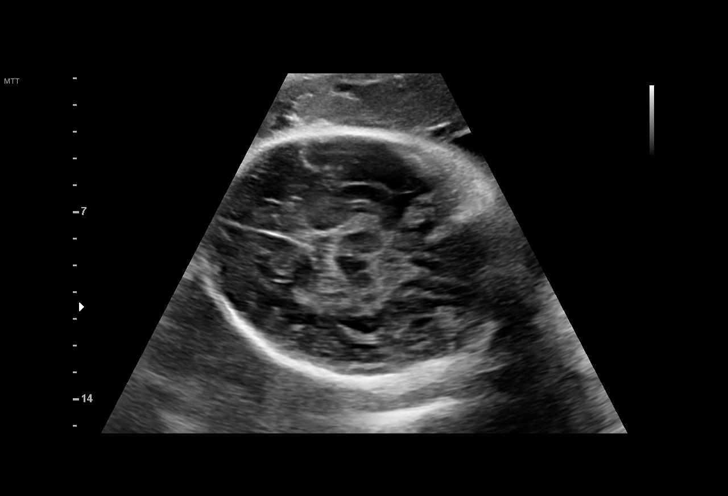
[im 12/46]
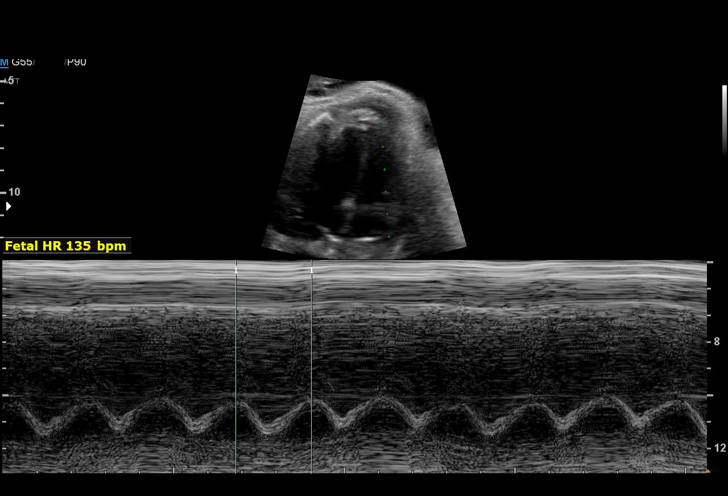
[im 16/46]
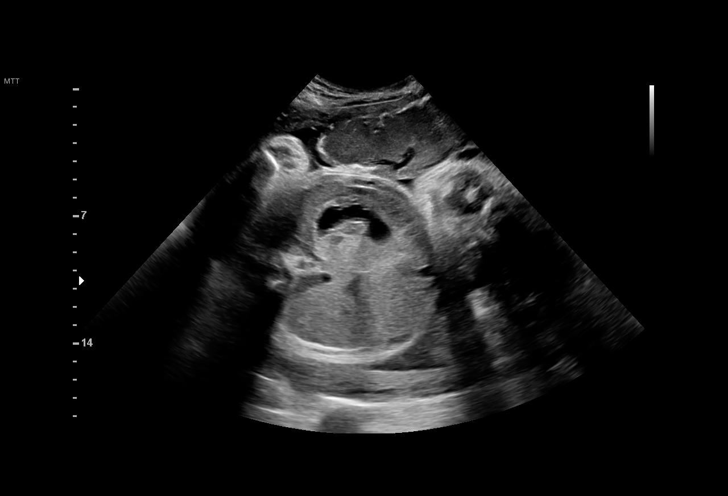
[im 19/46]
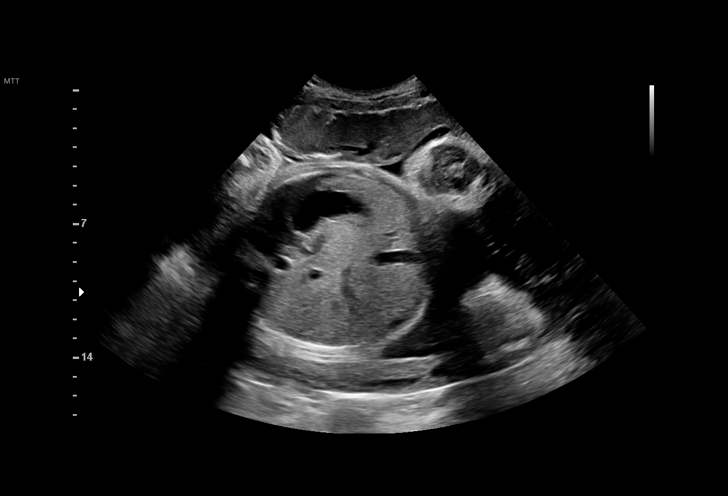
[im 24/46]
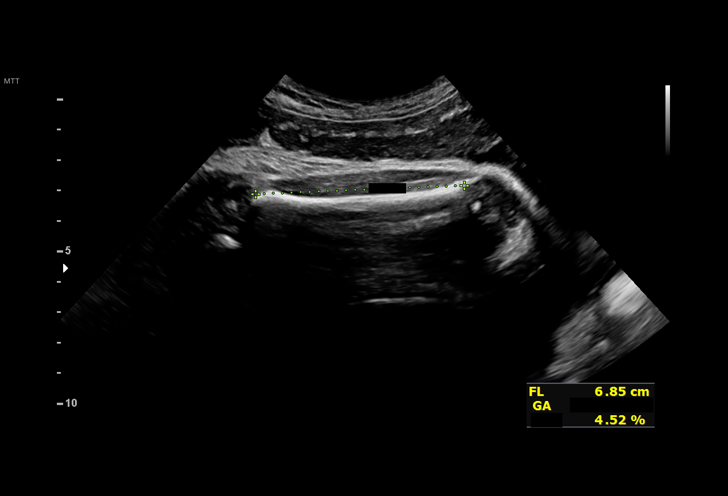
[im 27/46]
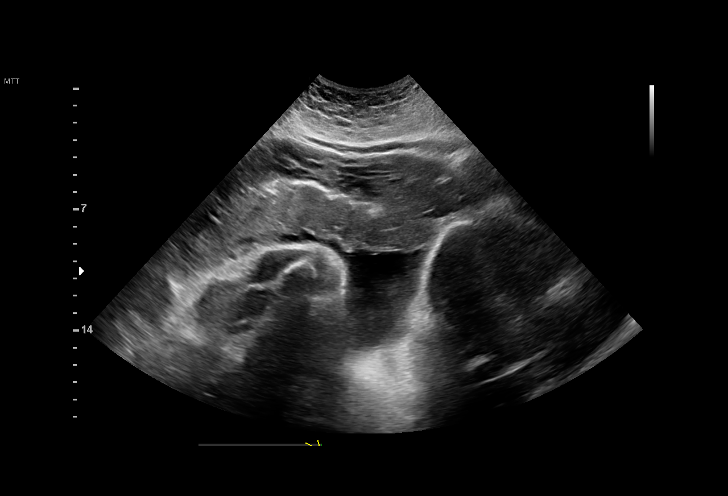
[im 31/46]
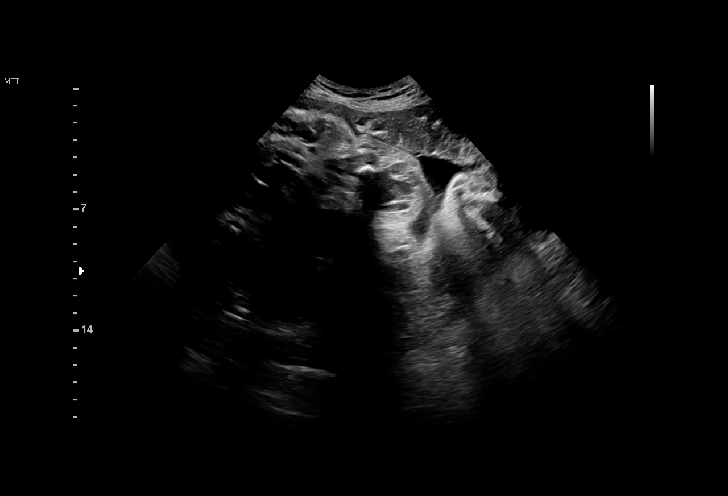
[im 34/46]
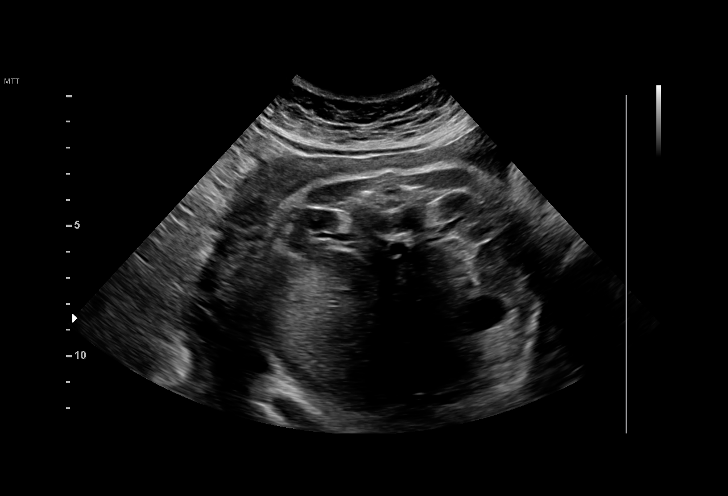
[im 37/46]
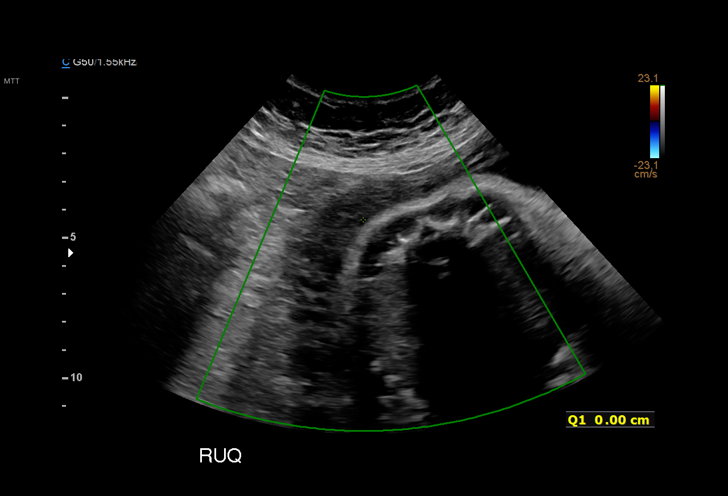
[im 41/46]
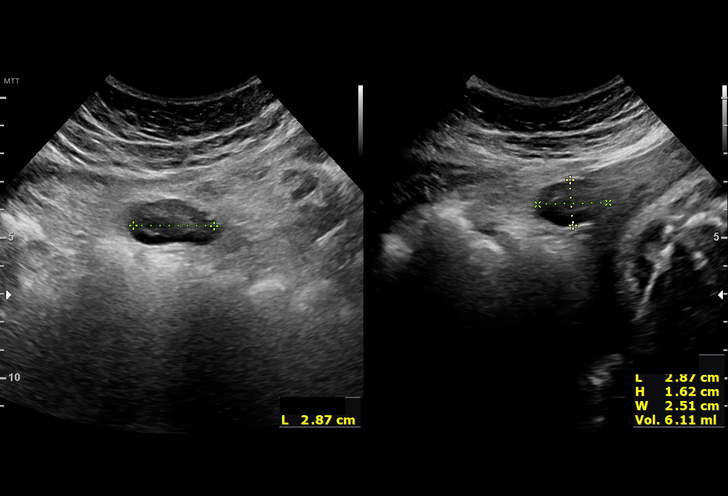
[im 44/46]
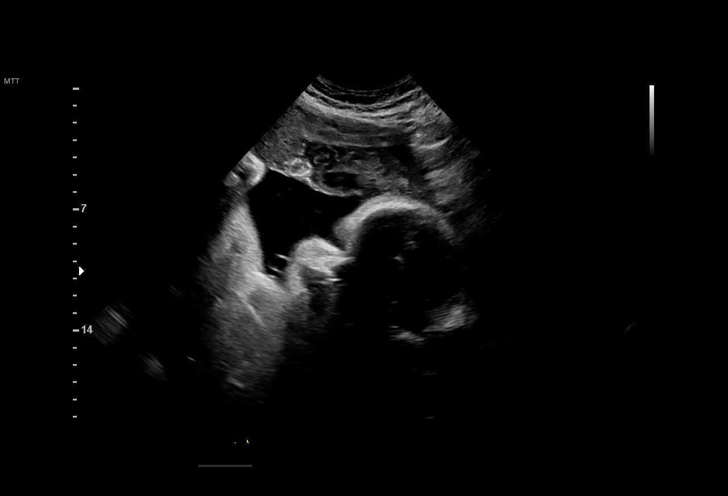

[13 of 28 positions shown; findings below may reference images not displayed]

----------------------------------------------------------------------

 ----------------------------------------------------------------------
Indications

  Gestational diabetes in pregnancy, diet
  controlled
  37 weeks gestation of pregnancy
  Maternal morbid obesity (Pre Pregnancy BMI
  45.7)
  Encounter for other antenatal screening
  follow-up
 ----------------------------------------------------------------------
Fetal Evaluation

 Num Of Fetuses:         1
 Fetal Heart Rate(bpm):  135
 Cardiac Activity:       Observed
 Presentation:           Cephalic
 Placenta:               Anterior
 P. Cord Insertion:      Previously Visualized

 Amniotic Fluid
 AFI FV:      Within normal limits

 AFI Sum(cm)     %Tile       Largest Pocket(cm)
 13.82           52

 RUQ(cm)       RLQ(cm)       LUQ(cm)        LLQ(cm)
 0
Biometry

 BPD:      93.2  mm     G. Age:  37w 6d         76  %    CI:        81.62   %    70 - 86
                                                         FL/HC:      21.0   %    20.9 -
 HC:      325.6  mm     G. Age:  36w 6d         13  %    HC/AC:      0.96        0.92 -
 AC:      338.7  mm     G. Age:  37w 5d         69  %    FL/BPD:     73.4   %    71 - 87
 FL:       68.4  mm     G. Age:  35w 1d          4  %    FL/AC:      20.2   %    20 - 24
 LV:        5.7  mm

 Est. FW:    9935  gm    6 lb 14 oz      43  %
OB History

 Gravidity:    5         Term:   2         SAB:   2
 Living:       2
Gestational Age

 LMP:           37w 5d        Date:  10/11/18                 EDD:   07/18/19
 U/S Today:     36w 6d                                        EDD:   07/24/19
 Best:          37w 5d     Det. By:  LMP  (10/11/18)          EDD:   07/18/19
Anatomy

 Cranium:               Previously seen        Aortic Arch:            Previously seen
 Cavum:                 Previously seen        Ductal Arch:            Previously seen
 Ventricles:            Appears normal         Diaphragm:              Appears normal
 Choroid Plexus:        Previously seen        Stomach:                Appears normal, left
                                                                       sided
 Cerebellum:            Previously seen        Abdomen:                Previously seen
 Posterior Fossa:       Previously seen        Abdominal Wall:         Previously seen
 Nuchal Fold:           Previously seen        Cord Vessels:           Previously seen
 Face:                  Orbits and profile     Kidneys:                Appear normal
                        previously seen
 Lips:                  Previously seen        Bladder:                Appears normal
 Thoracic:              Previously seen        Spine:                  Previously seen
 Heart:                 Previously seen        Upper Extremities:      Previously seen
 RVOT:                  Previously seen        Lower Extremities:      Previously seen
 LVOT:                  Previously seen

 Other:  Fetus appeared to be female previously. Heels visualized previously.
         Nasal bone visualized previously. Technically difficult due to maternal
         habitus and fetal position.
Cervix Uterus Adnexa

 Cervix
 Not visualized (advanced GA >43wks)

 Uterus
 No abnormality visualized.

 Left Ovary
 Not visualized. No adnexal mass visualized.

 Right Ovary
 Within normal limits. No adnexal mass visualized.

 Cul De Sac
 No free fluid seen.

 Adnexa
 No abnormality visualized.
Comments

 This patient was seen for a follow up growth scan due to diet-
 controlled gestational diabetes.  She denies any problems
 since her last exam and reports that her fingerstick values
 have been mostly within normal limits.
 She was informed that the fetal growth and amniotic fluid
 level appears appropriate for her gestational age.
 Due to gestational diabetes, delivery is recommended at
 around 39 weeks.
 No further exams were scheduled in our office.

## 2021-09-29 IMAGING — CR DG ABDOMEN 2V
3 series · 3 of 3 positions shown · non-contrast
Comparison: None.

CLINICAL DATA: Abdominal pain. Postpartum

EXAM:
ABDOMEN - 2 VIEW

[abdomen erect]
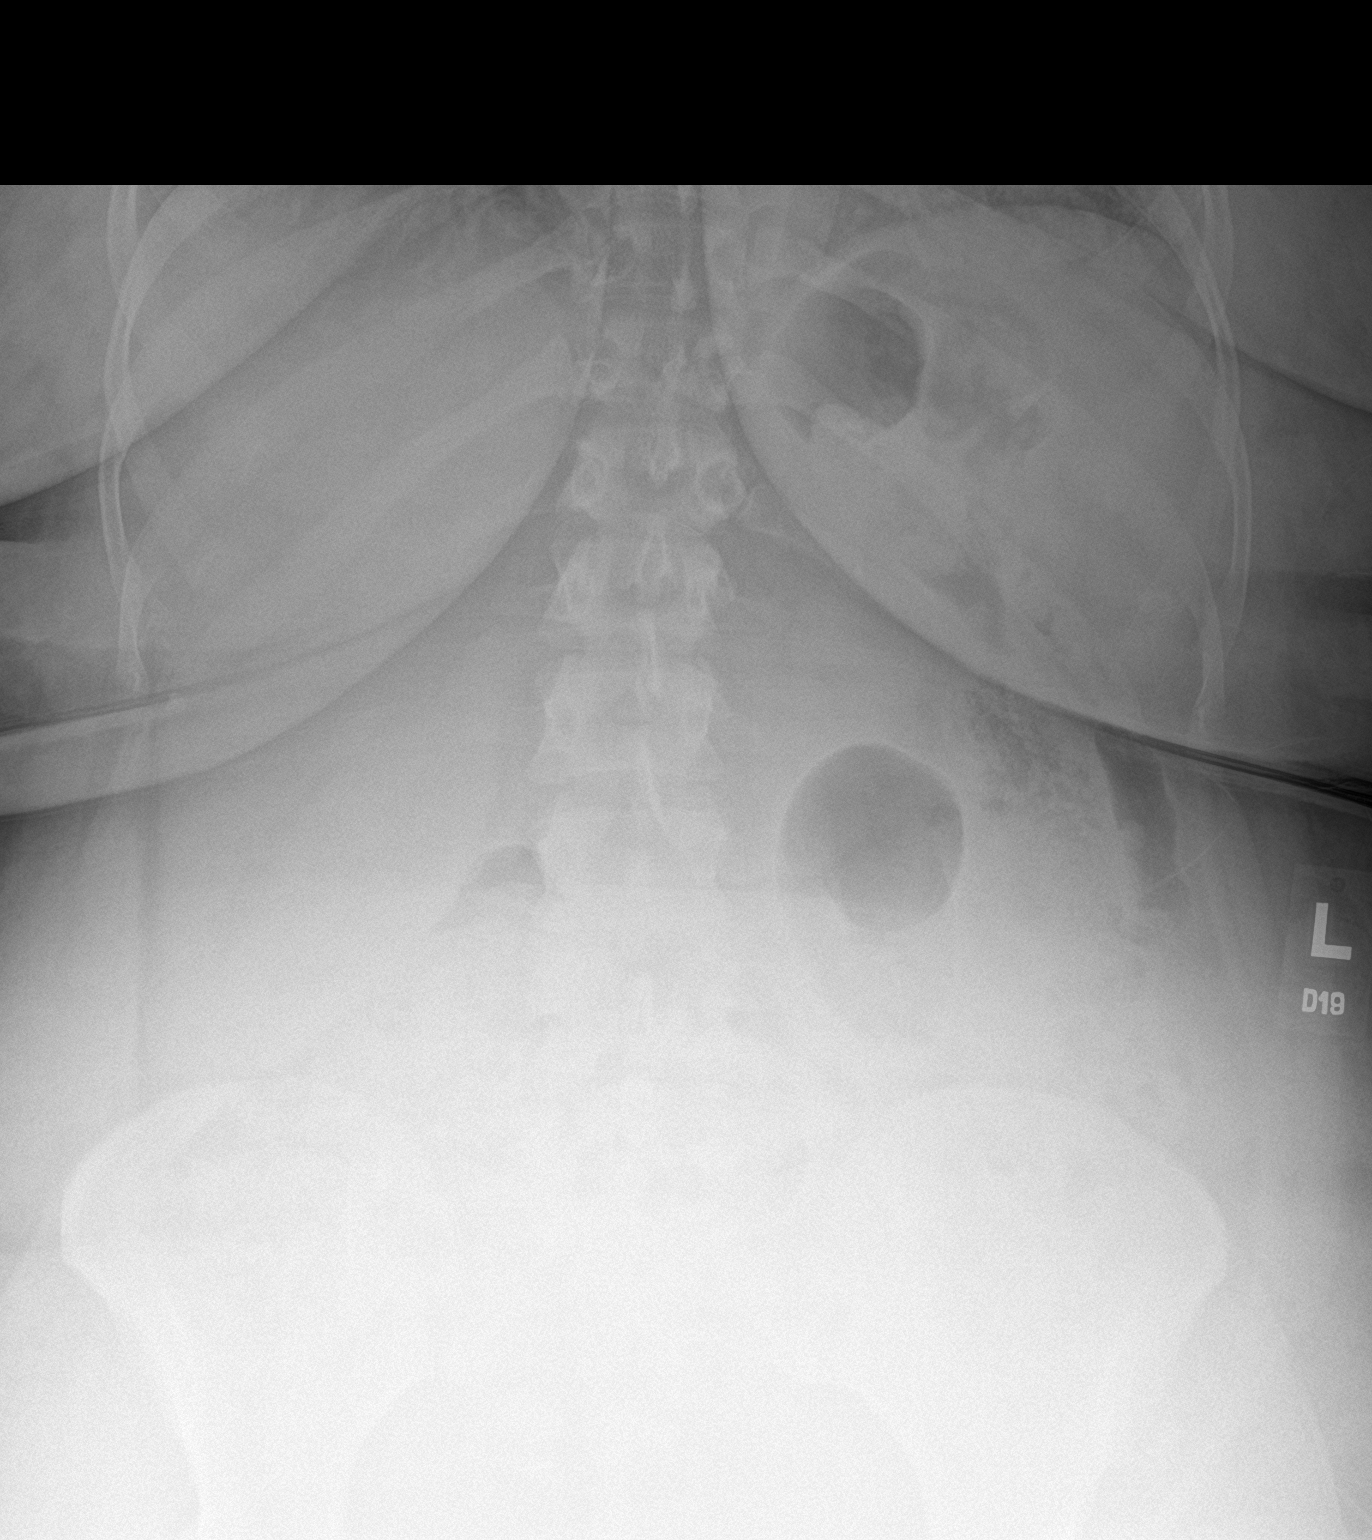

[abdomen supine (1 of 2)]
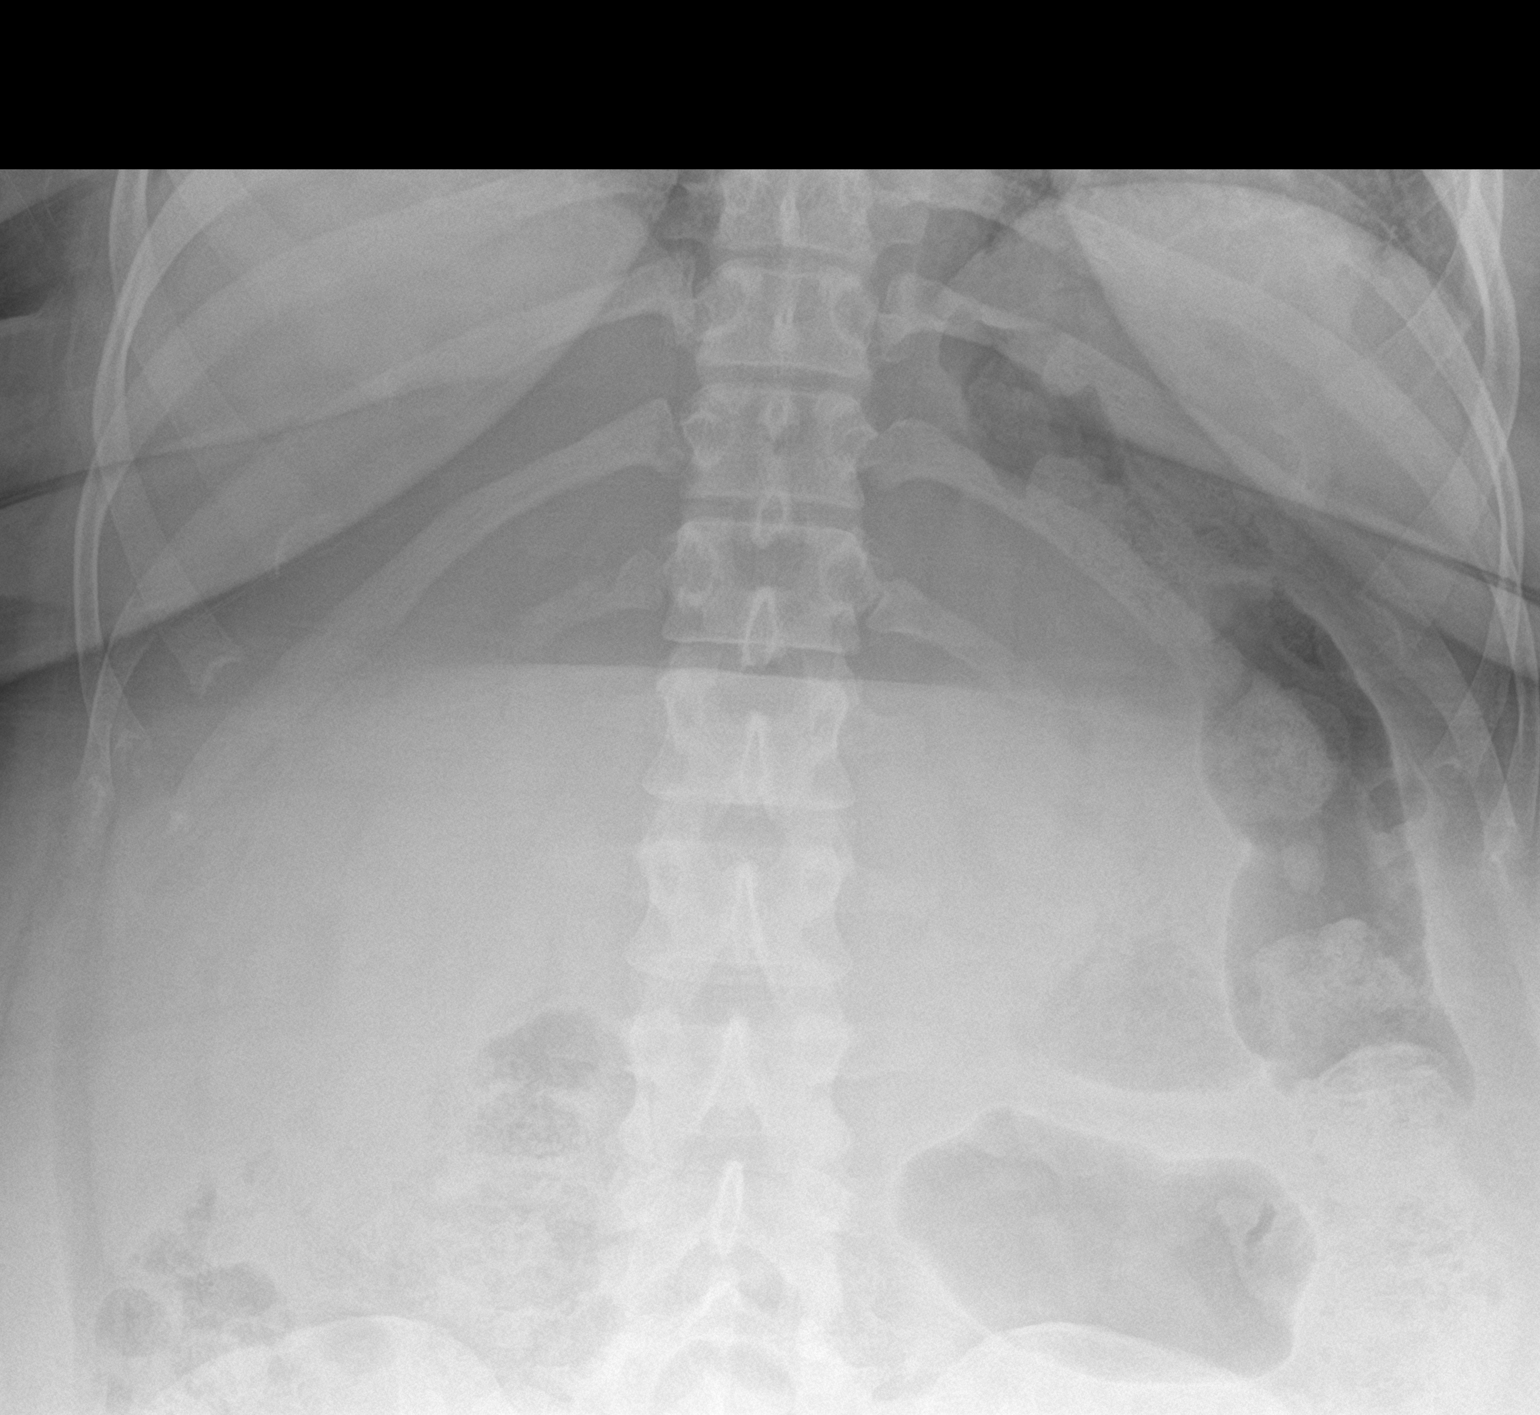

[abdomen supine (2 of 2)]
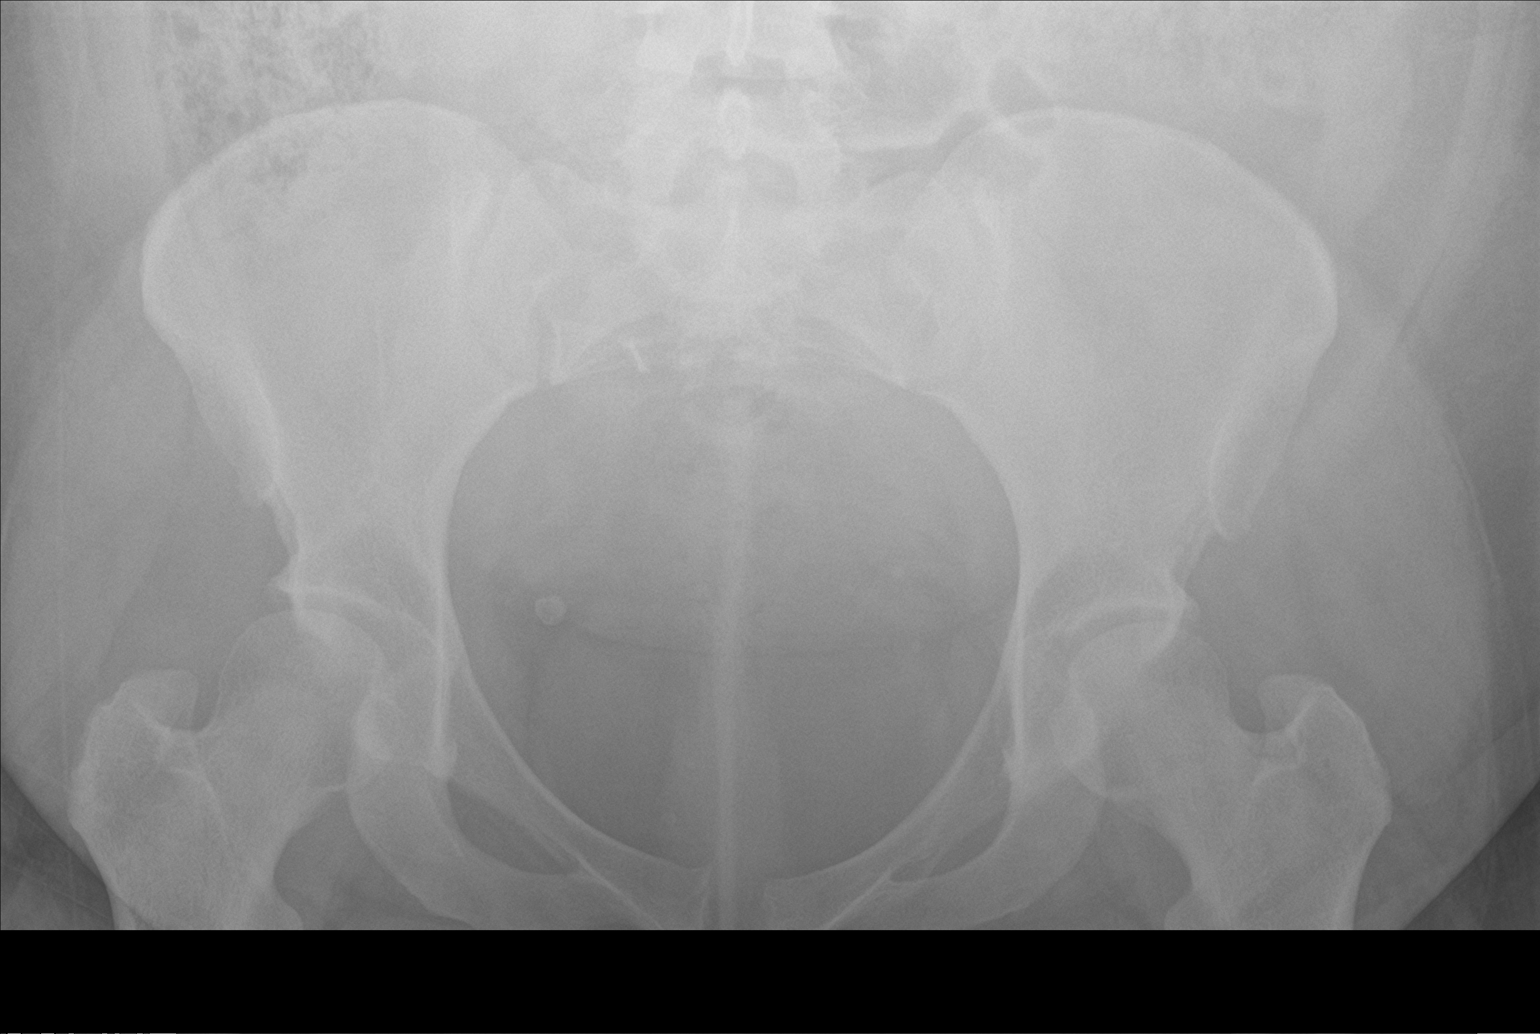

[3 of 3 positions shown; findings below may reference images not displayed]

FINDINGS: The bowel gas pattern is nonobstructive. Moderate volume of stool
within the colon. There is no evidence of free air. No radio-opaque
calculi is seen. Enlarged liver shadow measuring 23 cm in length at
the midclavicular line. Mild widening of the pubic symphysis, likely
related to postpartum state.
IMPRESSION: 1. Nonobstructive bowel gas pattern.
2. Moderate volume of stool within the colon.
3. Hepatomegaly.

## 2023-05-05 ENCOUNTER — Ambulatory Visit: Payer: 59 | Admitting: Obstetrics and Gynecology

## 2023-05-05 ENCOUNTER — Other Ambulatory Visit (HOSPITAL_COMMUNITY)
Admission: RE | Admit: 2023-05-05 | Discharge: 2023-05-05 | Disposition: A | Discharge status: Home / Self Care | Payer: 59 | Source: Ambulatory Visit | Attending: Obstetrics and Gynecology | Admitting: Obstetrics and Gynecology

## 2023-05-05 ENCOUNTER — Encounter: Payer: Self-pay | Admitting: Obstetrics and Gynecology

## 2023-05-05 VITALS — BP 122/81 | HR 77 | Ht 62.0 in | Wt 259.8 lb

## 2023-05-05 DIAGNOSIS — Z975 Presence of (intrauterine) contraceptive device: Secondary | ICD-10-CM

## 2023-05-05 DIAGNOSIS — Z01419 Encounter for gynecological examination (general) (routine) without abnormal findings: Secondary | ICD-10-CM | POA: Diagnosis present

## 2023-05-05 DIAGNOSIS — Z90721 Acquired absence of ovaries, unilateral: Secondary | ICD-10-CM | POA: Insufficient documentation

## 2023-05-05 NOTE — Progress Notes (Signed)
ANNUAL EXAM Patient name: Jennifer Ellison MRN [id number]  Date of birth: Sep 02, 1985 Chief Complaint:   Establish Care  History of Present Illness:   Jennifer Ellison is a 38 y.o. [phone]  female being seen today for a routine annual exam.  Current complaints: Doing well with Liletta IUD placed 07/12/19 She has a history of left ovarian cyst and had left oophorectomy. Denies pelvic or vaginal complaints. Taking combination pill for bp, managed by PCP. Reports working on lifestyle modifications, weight loss of 20 pounds, and HgbA1c is trending down almost out of prediabetic range.   Patient's last menstrual period was 11/21/2022 (approximate).   The pregnancy intention screening data noted above was reviewed. Potential methods of contraception were discussed. The patient elected to proceed with IUD  Last pap 12/12/2018. Results were:  NILM .  Last mammogram: 2023. Results were: normal. Family h/o breast cancer: no     05/02/2019   10:54 AM 12/05/2018    1:22 PM 03/10/2018    8:39 AM 10/24/2017   10:53 AM 08/17/2017    1:12 PM  Depression screen PHQ 2/9  Decreased Interest 0 0 0 0 0  Down, Depressed, Hopeless 0 0 0 0 0  PHQ - 2 Score 0 0 0 0 0         No data to display           Review of Systems:   Pertinent items are noted in HPI Denies any headaches, blurred vision, fatigue, shortness of breath, chest pain, abdominal pain, abnormal vaginal discharge/itching/odor/irritation, problems with periods, bowel movements, urination, or intercourse unless otherwise stated above. Pertinent History Reviewed:  Reviewed past medical,surgical, social and family history.  Reviewed problem list, medications and allergies. Physical Assessment:   Vitals:   05/05/23 0833  BP: 122/81  Pulse: 77  Weight: 259 lb 12.8 oz (117.8 kg)  Height: 5\' 2"  (1.575 m)  Body mass index is 47.52 kg/m.        Physical Examination:   General appearance - well appearing, and in no  distress  Mental status - alert, oriented   Psych:  She has a normal mood and affect  Skin - warm and dry, normal colors   Chest - effort normal, all lung fields clear to auscultation bilaterally  Heart - normal rate and regular rhythm  Neck:  midline trachea, no thyromegaly   Breasts - breasts appear normal, no suspicious masses, no skin or nipple changes or  axillary nodes  Abdomen - soft,  Pelvic - VULVA: normal appearing vulva with no masses, tenderness or lesions  VAGINA: normal appearing vagina with normal color and discharge, no lesions  CERVIX: normal appearing cervix without discharge or lesions, no CMT  Thin prep pap is done w HR HPV cotesting  IUD in place   UTERUS: uterus is felt to be normal size, shape, consistency and nontender   ADNEXA: No adnexal masses or tenderness noted.   Extremities:  No swelling or varicosities noted  Chaperone present for exam  No results found for this or any previous visit (from the past 24 hours).  Assessment & Plan:  1. Well woman exam (Primary) Sees PCP regularly, they manage BP meds Mammogram at 40 Decline need for STD screening today  - Cytology - PAP( [place] [facility] [place])  2. IUD (intrauterine device) in place In place, discussed removal 2028 unless desires removal  Using for cycle control, discussed mechanism of action and how affects cycles   Labs/procedures today:  Mammogram: @ 38yo, or sooner if problems   No orders of the defined types were placed in this encounter.   Meds: No orders of the defined types were placed in this encounter.   Follow-up: Return in about 1 year (around 05/04/2024) for Thayer Jew, FNP

## 2023-05-05 NOTE — Progress Notes (Signed)
IUD in place. Liletta x 64yrs. No problems but occasional right adnexal pain. Left ovary is gone. Benign cyst and removed ovary at same time approx 2013. Occasional spotting but no reg menses. Takes multiple meds but can't remember. Will call them in after exam.

## 2023-05-06 LAB — CYTOLOGY - PAP
Comment: NEGATIVE
Diagnosis: NEGATIVE
High risk HPV: NEGATIVE
